# Patient Record
Sex: Male | Born: 1965 | Race: Black or African American | Hispanic: No | Marital: Single | Smoking: Former smoker
Health system: Southern US, Community
[De-identification: ages and names within clinical notes are randomized; demographics above are authoritative.]

## PROBLEM LIST (undated history)

## (undated) DIAGNOSIS — K649 Unspecified hemorrhoids: Secondary | ICD-10-CM

## (undated) DIAGNOSIS — E119 Type 2 diabetes mellitus without complications: Secondary | ICD-10-CM

## (undated) DIAGNOSIS — N492 Inflammatory disorders of scrotum: Secondary | ICD-10-CM

## (undated) DIAGNOSIS — G709 Myoneural disorder, unspecified: Secondary | ICD-10-CM

## (undated) HISTORY — DX: Inflammatory disorders of scrotum: N49.2

## (undated) HISTORY — DX: Unspecified hemorrhoids: K64.9

## (undated) HISTORY — PX: INCISE AND DRAIN ABCESS: PRO64

## (undated) HISTORY — PX: ULNAR NERVE TRANSPOSITION: SHX2595

## (undated) HISTORY — DX: Type 2 diabetes mellitus without complications: E11.9

## (undated) HISTORY — PX: CATARACT EXTRACTION: SUR2

## (undated) HISTORY — DX: Myoneural disorder, unspecified: G70.9

---

## 2004-08-12 ENCOUNTER — Observation Stay (HOSPITAL_COMMUNITY): Admission: RE | Admit: 2004-08-12 | Discharge: 2004-08-12 | Payer: Self-pay | Admitting: Family Medicine

## 2006-01-08 IMAGING — CR DG ABDOMEN ACUTE W/ 1V CHEST
3 series · 3 of 3 positions shown · non-contrast
Comparison: none

CLINICAL DATA: Partial SBO.
 ABDOMEN ? 2 VIEWS:
 Supine and upright views continue to show a few small air-fluid levels in mildly dilated small intestine.  No free air is seen.  There is iodinated contrast in the bladder.  Bony structures are unremarkable.
 CHEST ? 1 VIEW:
 Heart size is normal.  The mediastinum is unremarkable.  The lungs are clear.  No free air is seen under the diaphragm.

[view not recorded (1 of 3)]
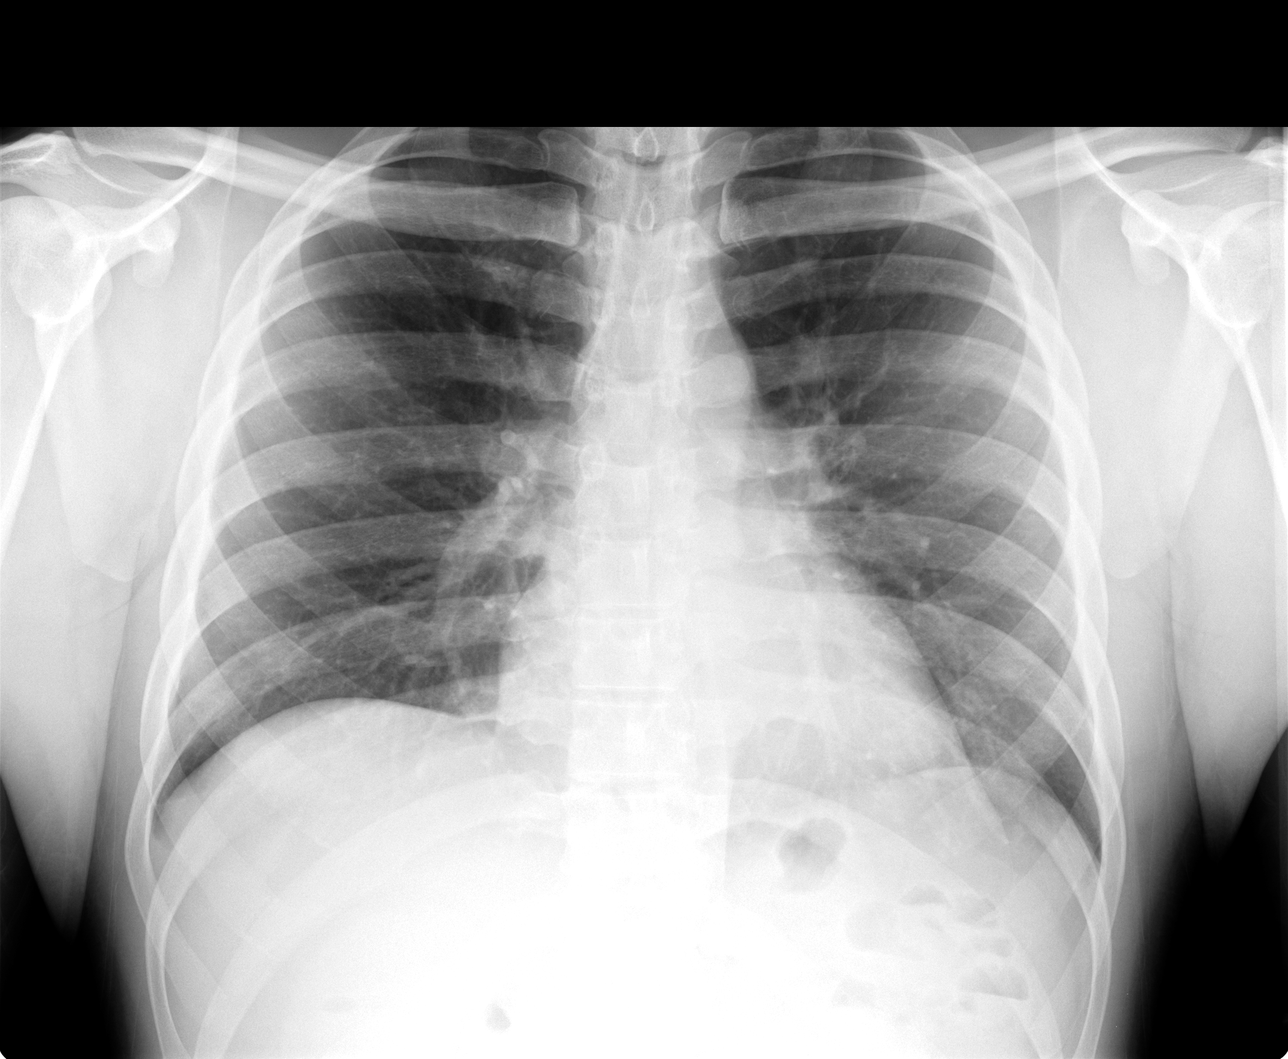

[view not recorded (2 of 3)]
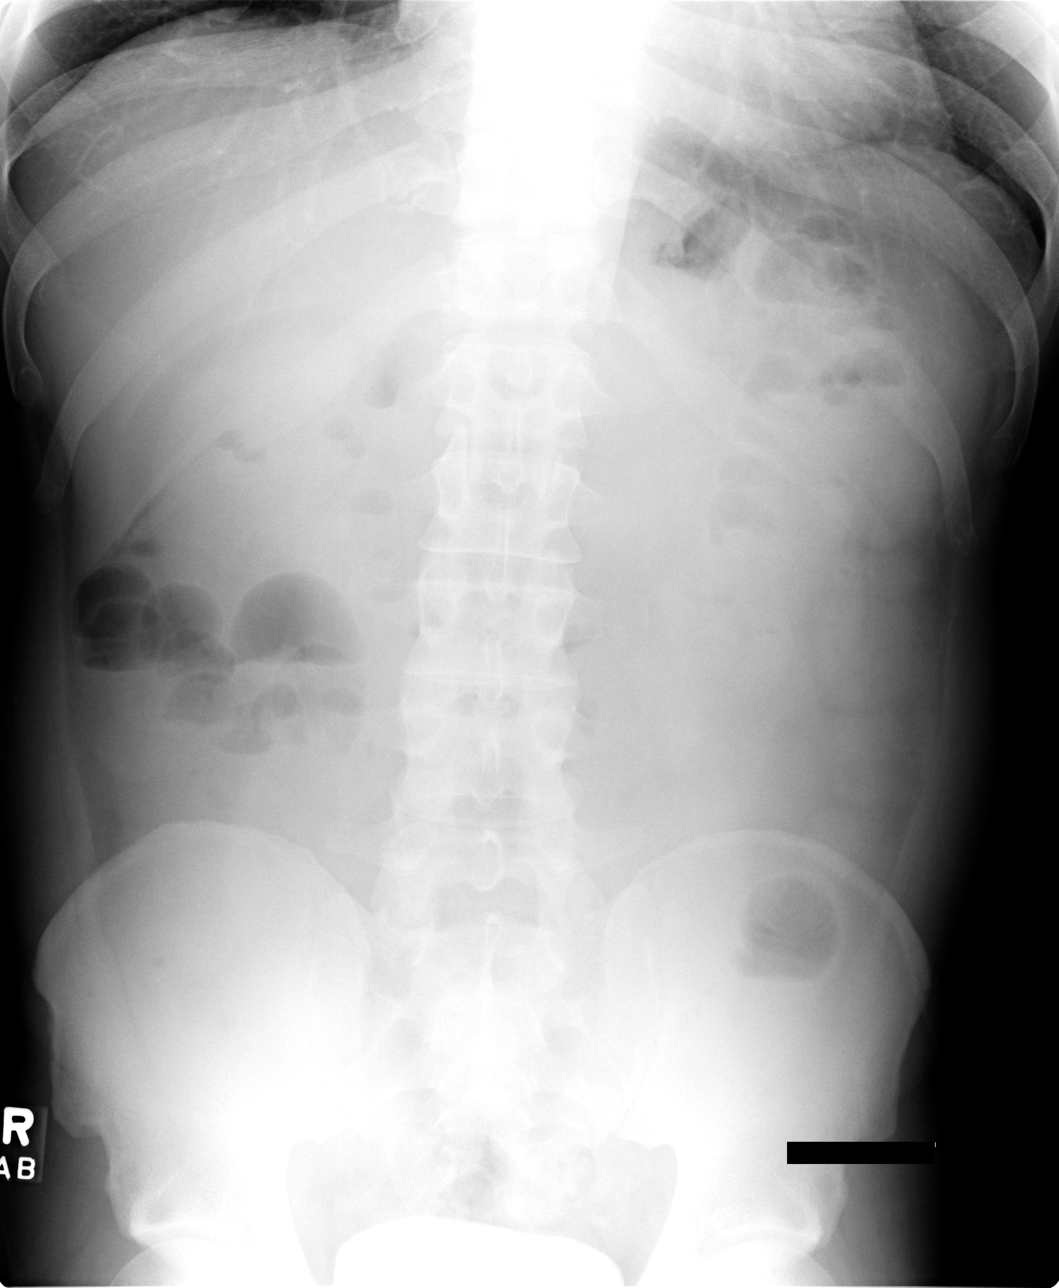

[view not recorded (3 of 3)]
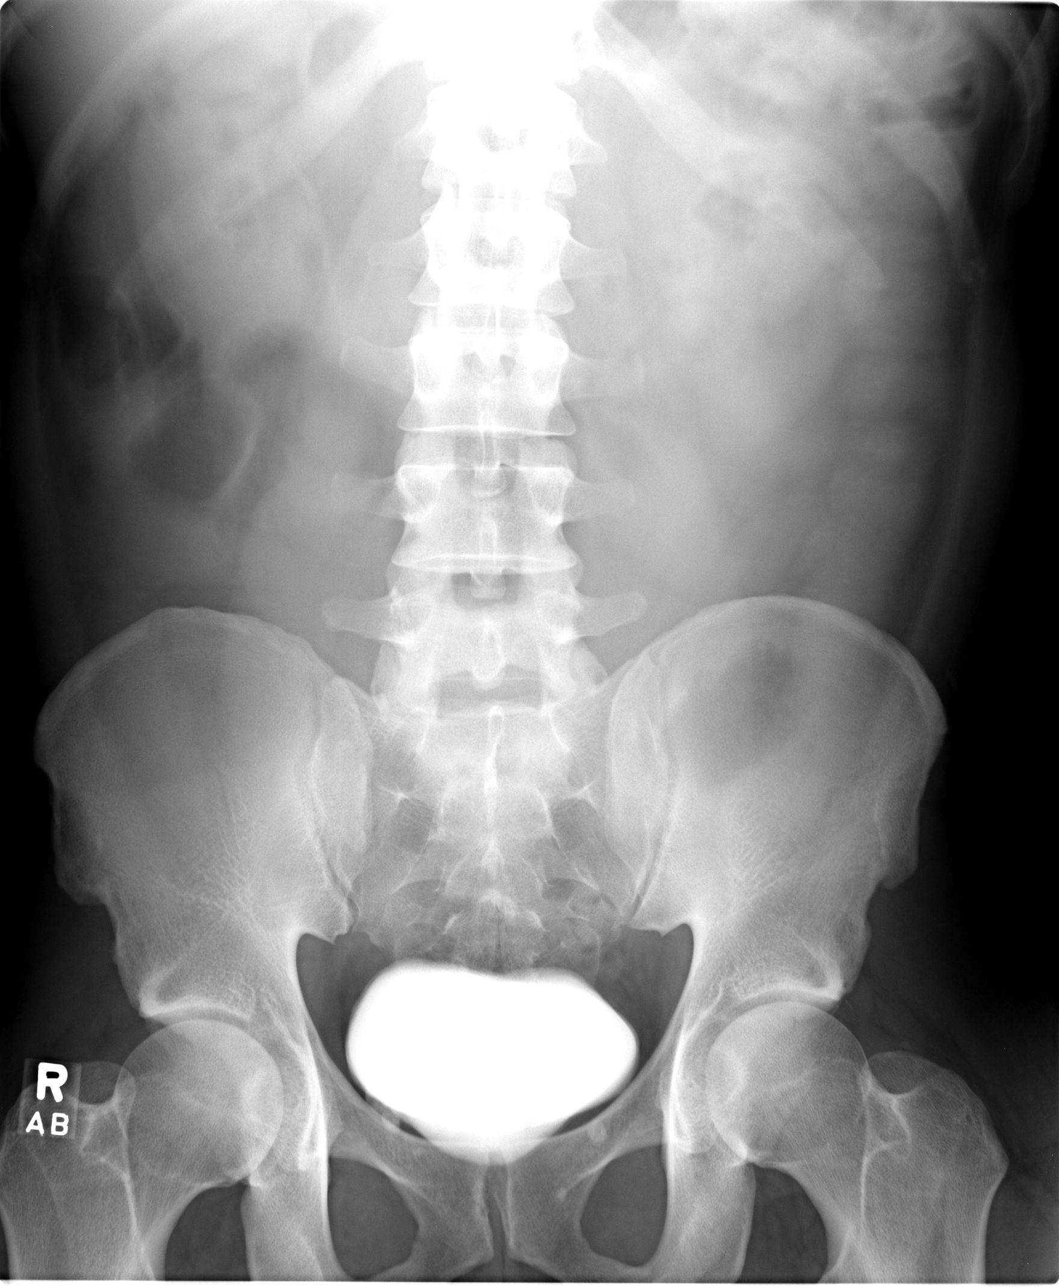

[3 of 3 positions shown; findings below may reference images not displayed]

IMPRESSION: A few dilated fluid and air-filled loops of small bowel suggesting some residual small bowel obstruction.

## 2006-01-08 IMAGING — US US ABDOMEN COMPLETE
1 series · 14 of 25 positions shown · non-contrast
Comparison: none

CLINICAL DATA: 38-year-old with small bowel obstruction and abdominal pain.
 ABDOMINAL ULTRASOUND:
 Liver is homogeneous in echotexture.  There is no intrahepatic or extrahepatic biliary ductal dilatation.  The gallbladder has a normal appearance.  The inferior vena cava and abdominal aorta are normal in caliber.  No focal abnormality is seen in the pancreas, spleen, or right kidney.  Within the mid to lower pole region of the left kidney, there is a small angiomyolipoma versus capsular lipoma, a benign lesion.

[Series 1: abdomen · 0.33mm/px · 14 of 95 slices shown]
[im 1/95]
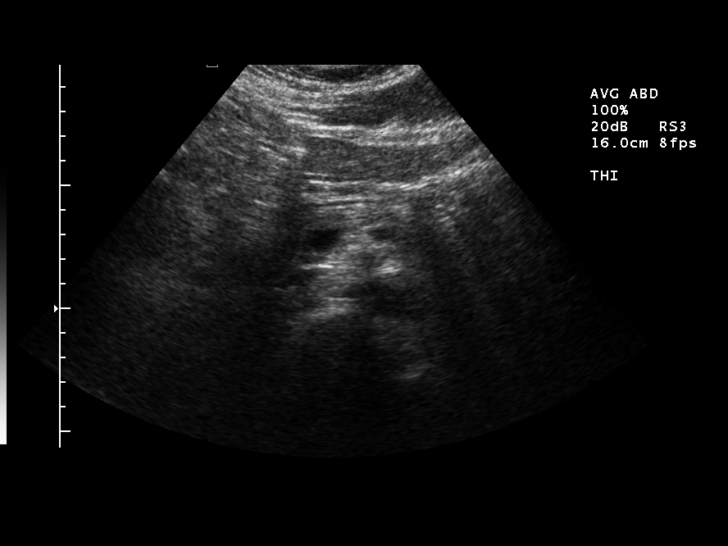
[im 8/95]
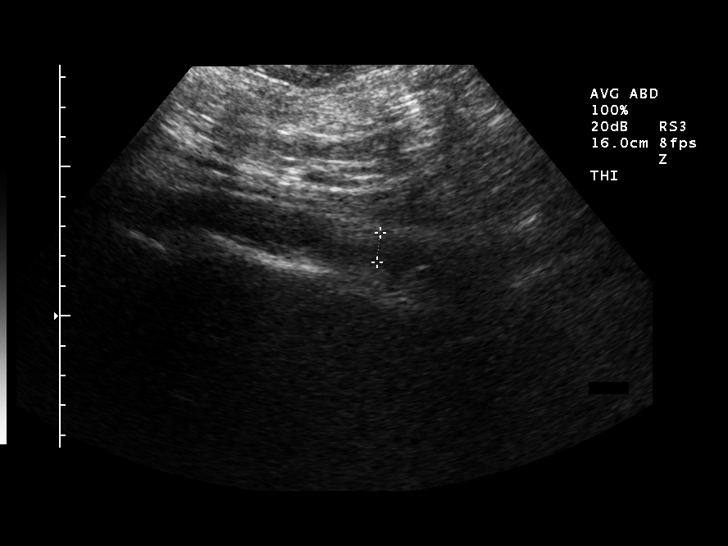
[im 16/95]
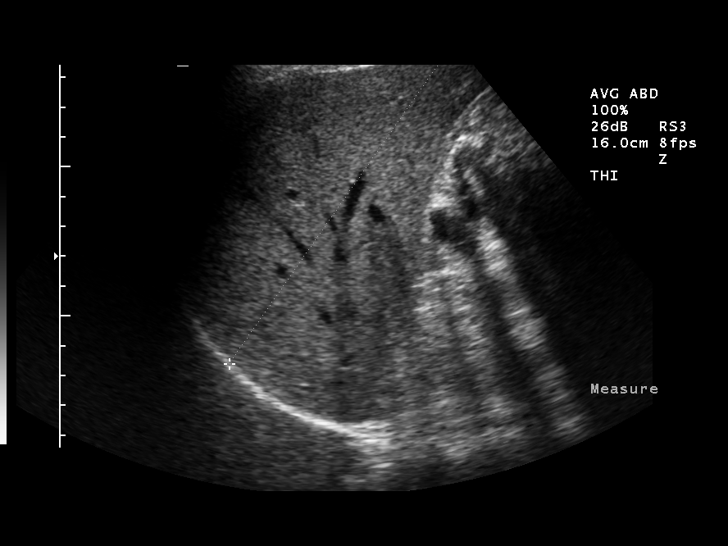
[im 24/95]
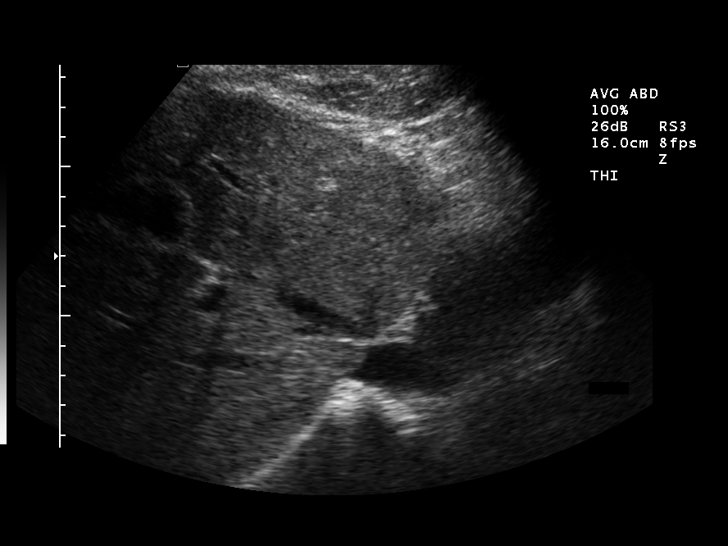
[im 32/95]
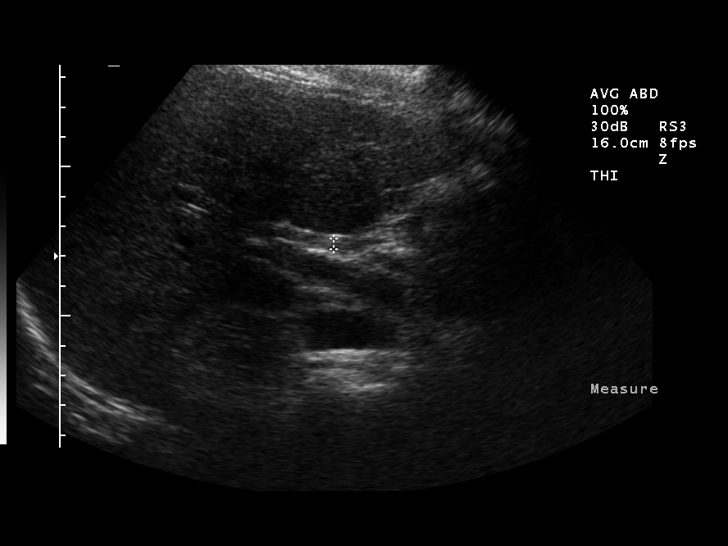
[im 36/95]
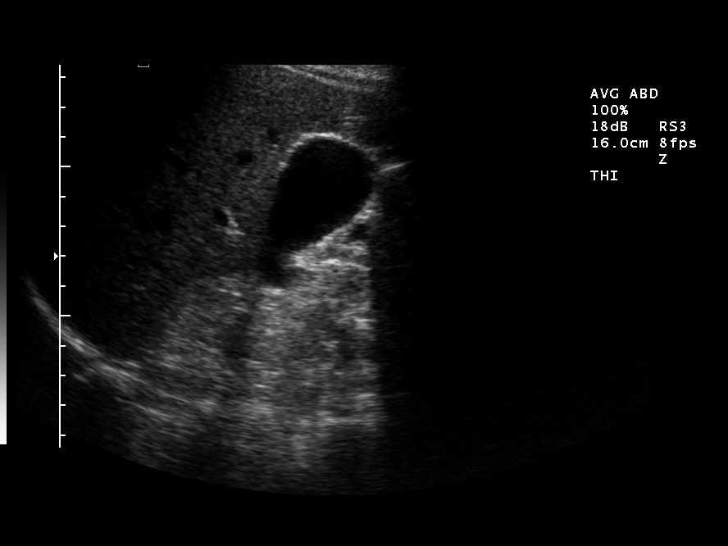
[im 44/95]
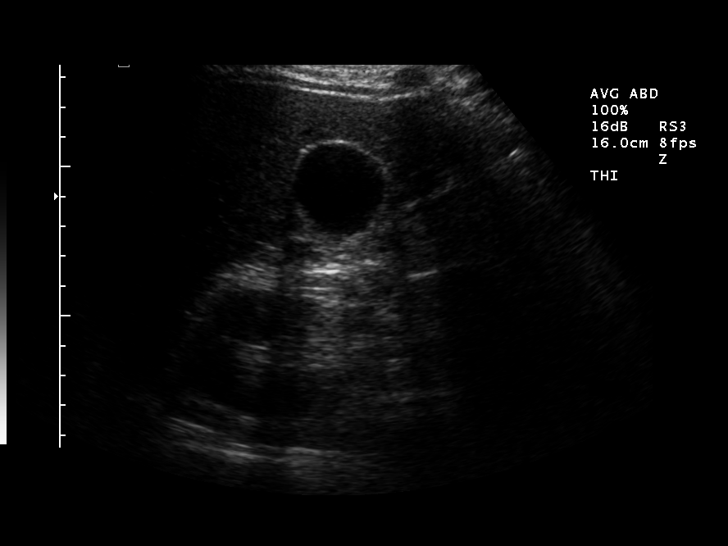
[im 51/95]
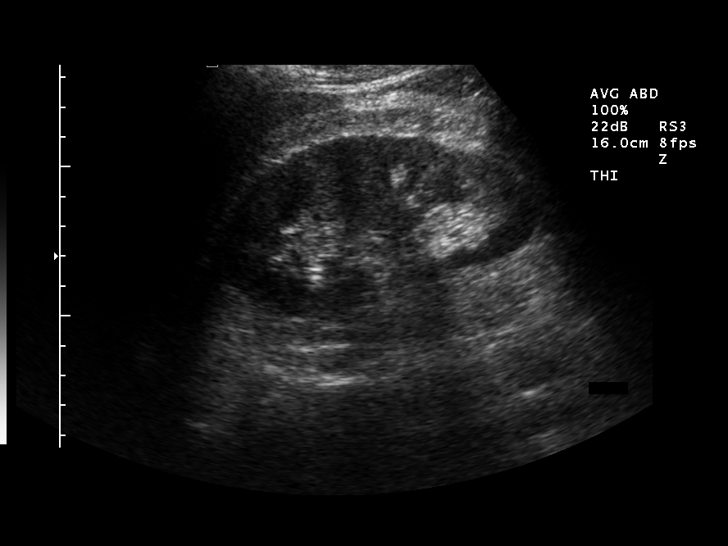
[im 59/95]
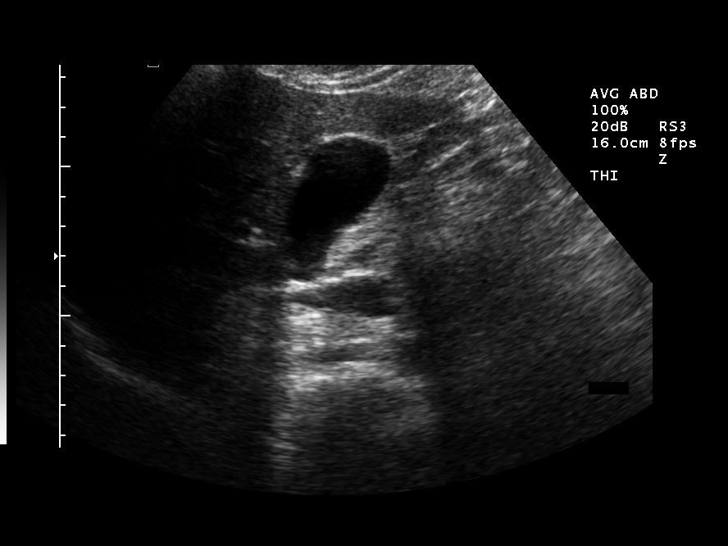
[im 63/95]
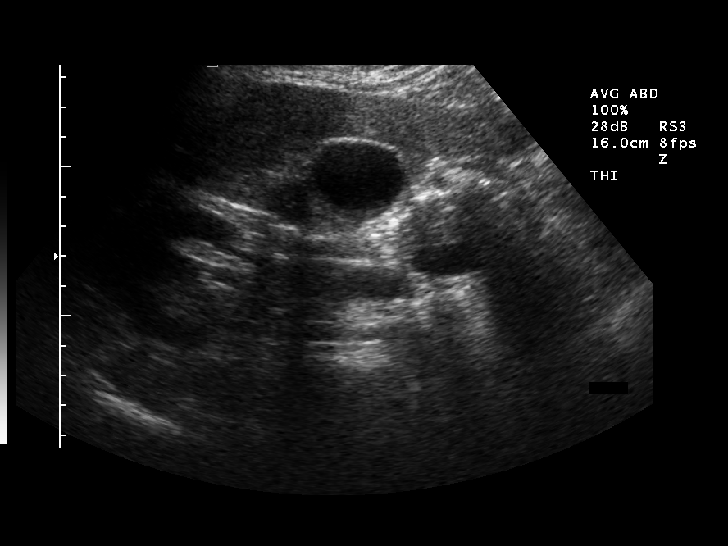
[im 71/95]
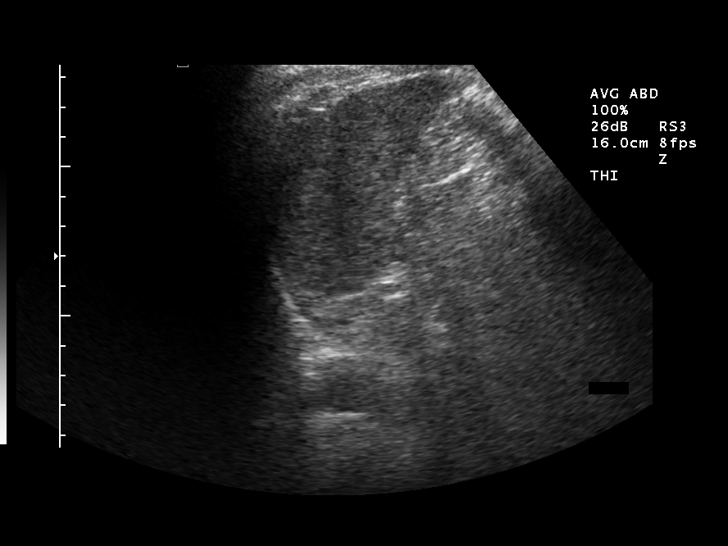
[im 79/95]
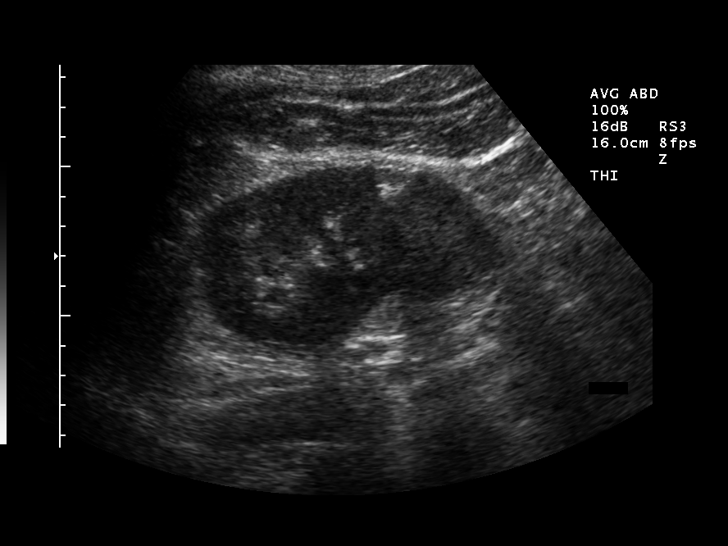
[im 87/95]
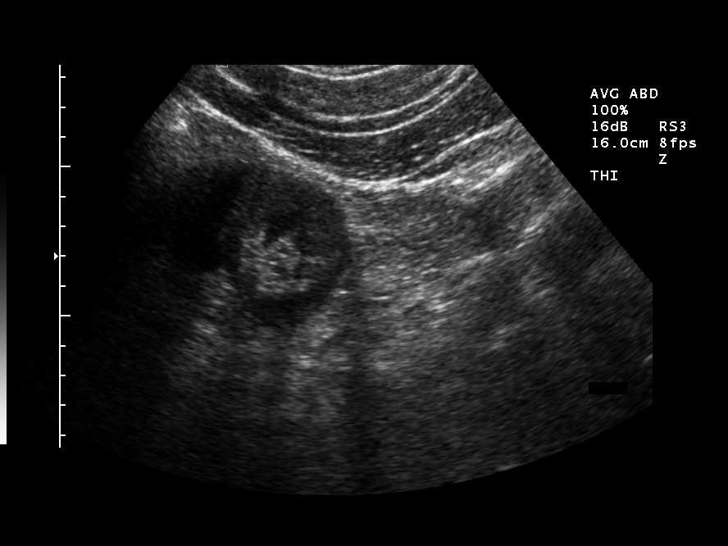
[im 95/95]
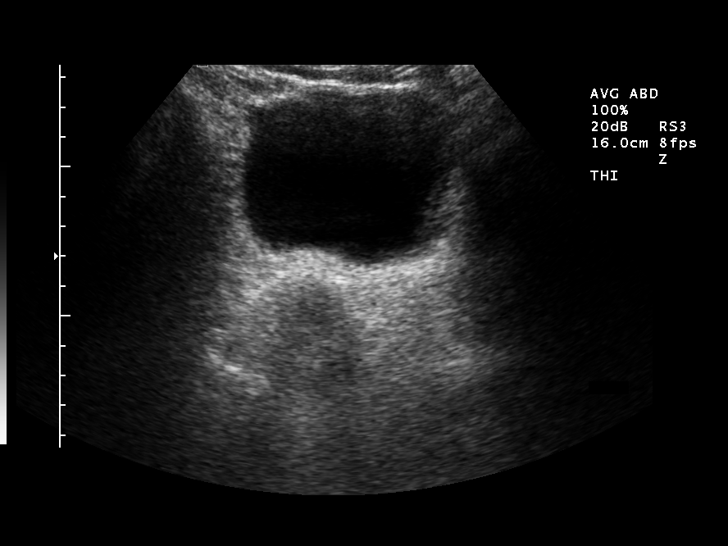

[14 of 25 positions shown; findings below may reference images not displayed]

IMPRESSION: 1.  No evidence for acute abnormality of the abdomen.
 2.  Angiomyolipoma versus small capsular lipoma left kidney.

## 2006-01-08 IMAGING — CT CT ABDOMEN W/ CM
1 of 4 series · 13 of 32 positions shown, 18 images · IV contrast (APPLIED)
Comparison: none

CLINICAL DATA: Abdominal pain.  Evaluate for obstruction or appendicitis.  
CT ABDOMEN WITH CONTRAST:
Multidetector helical scans through the abdomen were performed after IV contrast media were given.  145 cc of Omnipaque 300 were given as the contrast media.  
The lung bases are clear.  The liver enhances normally with no focal abnormality.  On delayed images, the liver is somewhat low in attenuation which may indicate fatty infiltration.  No calcified gallstones are seen.  The pancreas is normal in size and the adrenal glands and spleen appear normal.  The kidneys enhance normally and on delayed images the pelvocaliceal systems appear normal.  No adenopathy is seen.  However, there are multiple dilated fluid filled loops of small bowel consistent with partial small bowel obstruction.  CT of the pelvis is to be performed.

[Series 2: abd/pelvis 5.0 b30f · axial · 0.70mm/px · z∈[-445,-50]mm · 13 of 89 slices shown, 18 images]
[im 5/89  soft-tissue]
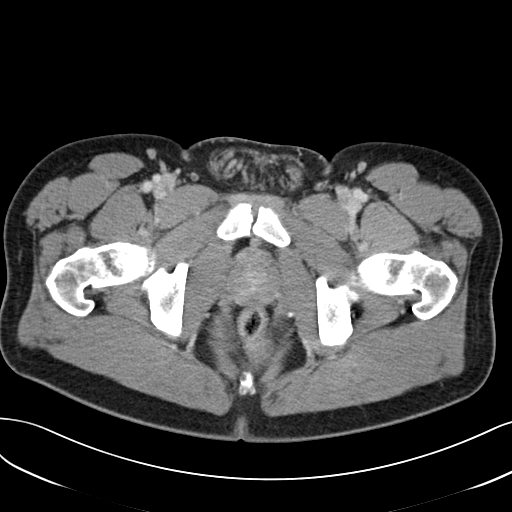
[im 5/89  bone]
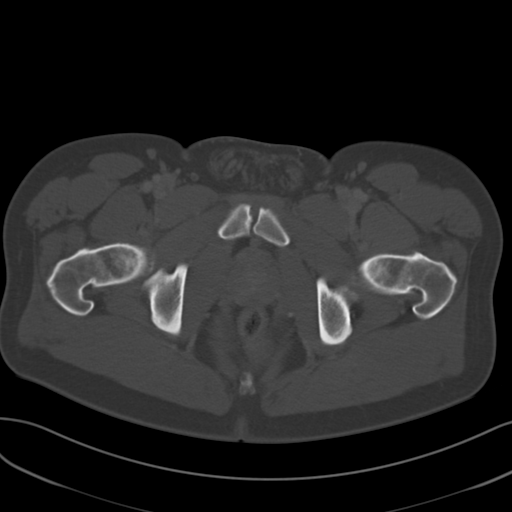
[im 14/89  soft-tissue]
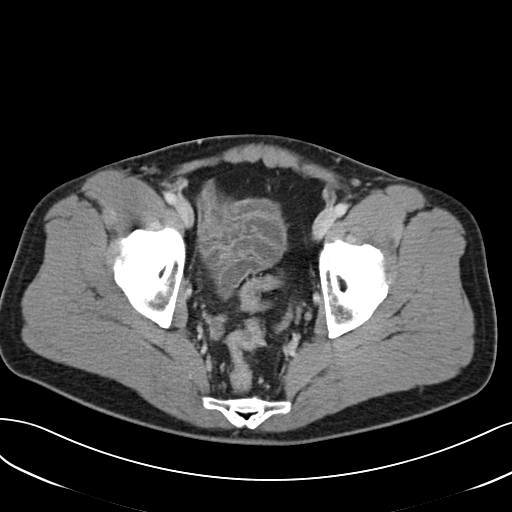
[im 19/89  soft-tissue]
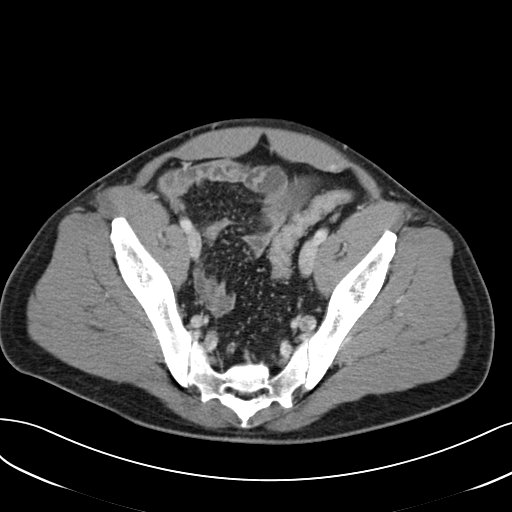
[im 28/89  soft-tissue]
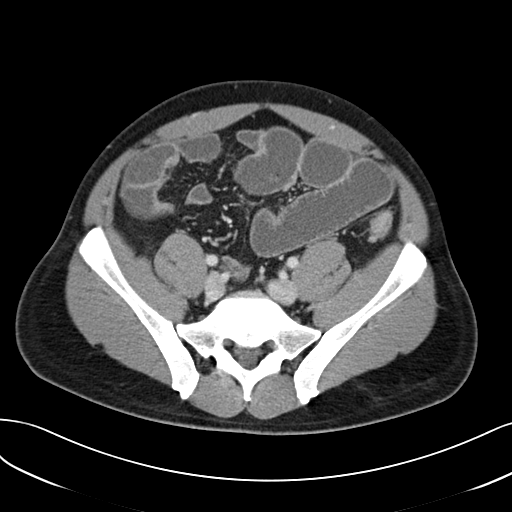
[im 33/89  soft-tissue]
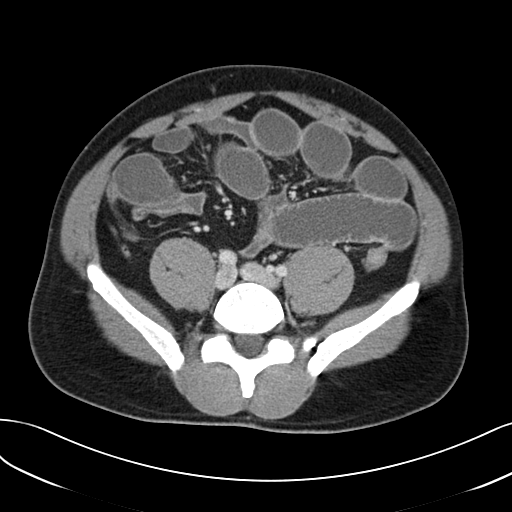
[im 42/89  soft-tissue]
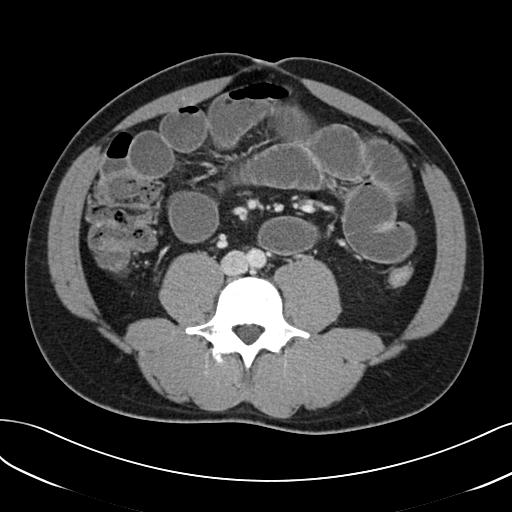
[im 47/89  soft-tissue]
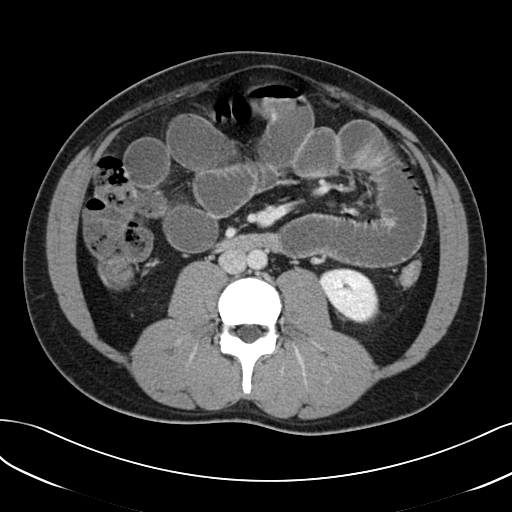
[im 56/89  soft-tissue]
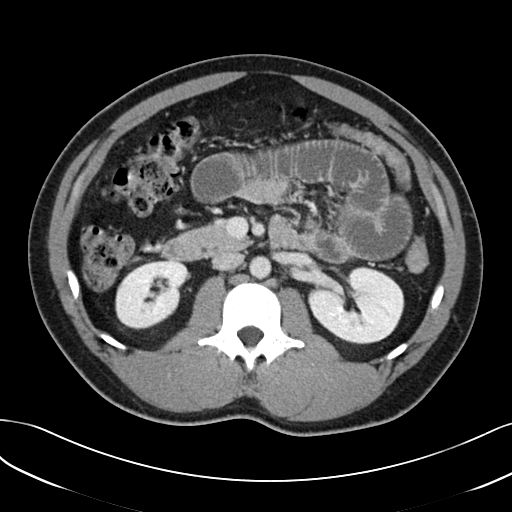
[im 61/89  soft-tissue]
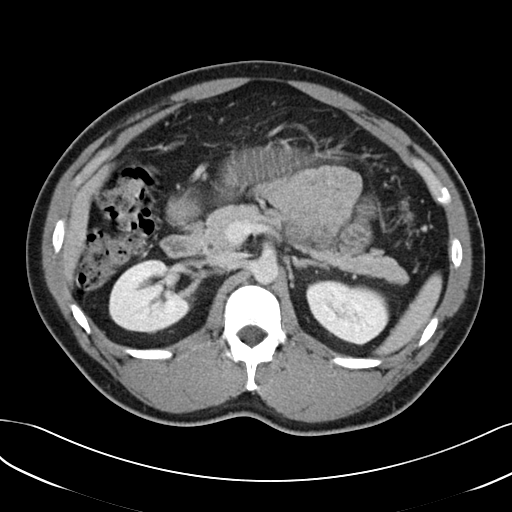
[im 61/89  bone]
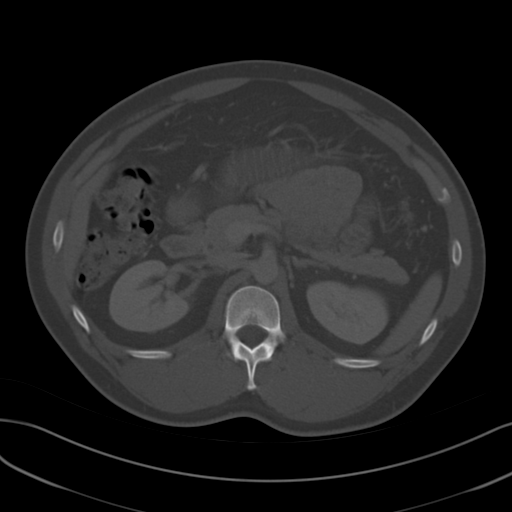
[im 70/89  soft-tissue]
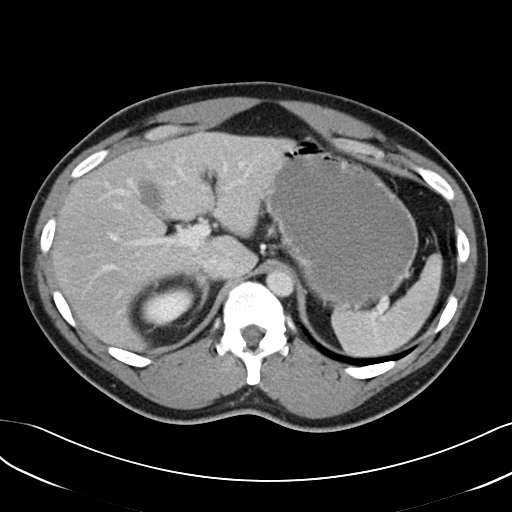
[im 70/89  lung]
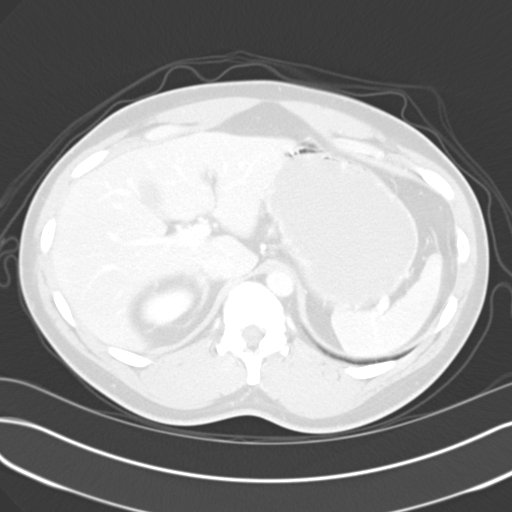
[im 75/89  soft-tissue]
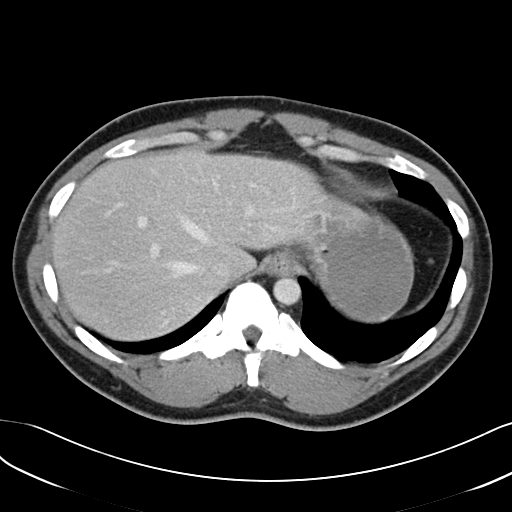
[im 75/89  lung]
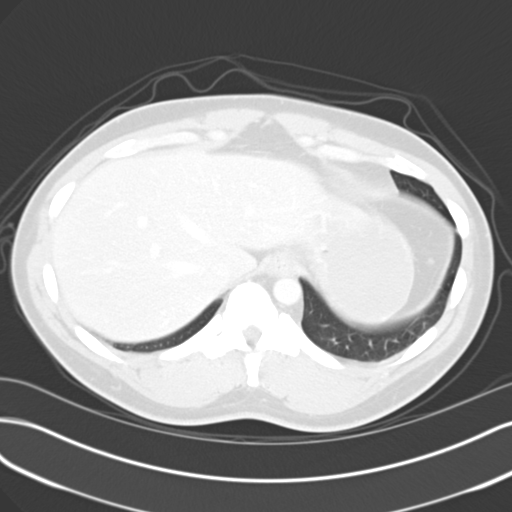
[im 79/89  lung]
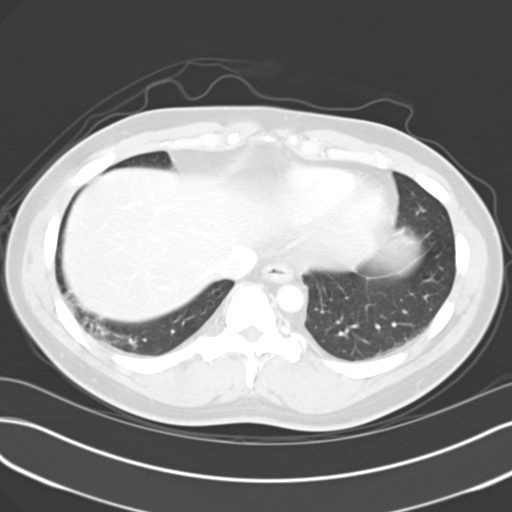
[im 84/89  soft-tissue]
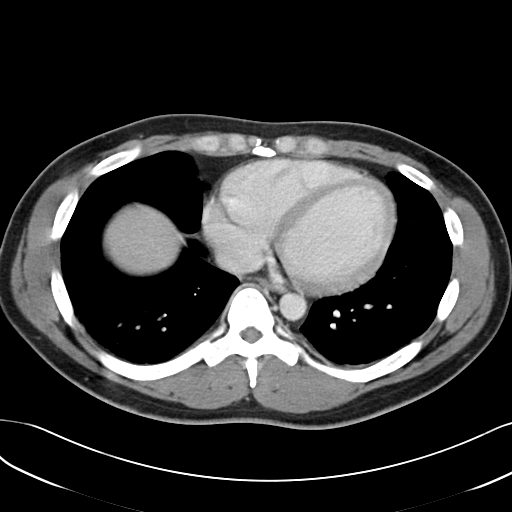
[im 84/89  lung]
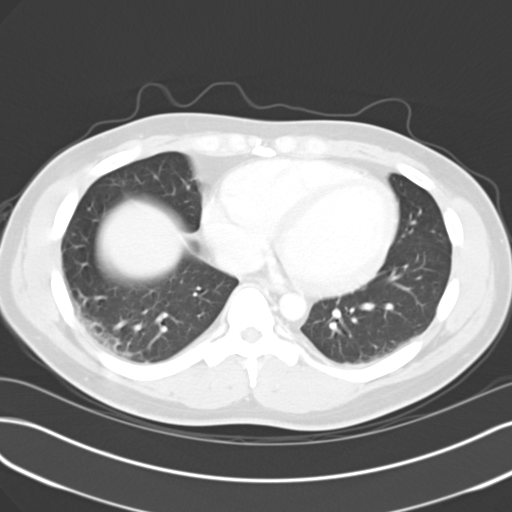

[13 of 32 positions shown; findings below may reference images not displayed]

IMPRESSION: Partial small bowel obstruction.  CT of the pelvis to be performed. 
CT PELVIS:
Scans were continued through the pelvis after IV contrast medial was given.  The dilated fluid filled loops of small bowel noted on CT of the abdomen continue into the pelvis.  There are decompressed loops of small bowel however of the distal ileum.  The point of caliber change appears to be in the right lower quadrant ? right pelvis without definite mass or inflammatory process.  The appendix is relatively well seen and appears normal.  The urinary bladder is decompressed.  No free fluid is seen within the pelvis.  No bony abnormality is noted.   There is an area of higher attenuation, which may be very near the caliber change in the right lower quadrant ? right bony pelvis.  Mass at this site cannot be excluded and further evaluation with small bowel follow through may be helpful to assess further.
IMPRESSION: Partial small bowel obstruction with caliber change in right lower quadrant ? right pelvis with no definite inflammatory process.  The appendix appears normal.  Difficult to exclude mass or adhesion at point of caliber change as noted above.  Consider small bowel follow through.  
This report was called to Dr. Jerman at the time of interpretation who will request surgical consultation.

## 2011-07-16 ENCOUNTER — Ambulatory Visit (INDEPENDENT_AMBULATORY_CARE_PROVIDER_SITE_OTHER): Payer: PRIVATE HEALTH INSURANCE

## 2011-07-16 DIAGNOSIS — K624 Stenosis of anus and rectum: Secondary | ICD-10-CM

## 2011-07-16 DIAGNOSIS — K645 Perianal venous thrombosis: Secondary | ICD-10-CM

## 2011-07-16 DIAGNOSIS — R42 Dizziness and giddiness: Secondary | ICD-10-CM

## 2011-07-18 ENCOUNTER — Ambulatory Visit (INDEPENDENT_AMBULATORY_CARE_PROVIDER_SITE_OTHER): Payer: PRIVATE HEALTH INSURANCE

## 2011-07-18 DIAGNOSIS — K644 Residual hemorrhoidal skin tags: Secondary | ICD-10-CM

## 2011-07-19 ENCOUNTER — Ambulatory Visit (INDEPENDENT_AMBULATORY_CARE_PROVIDER_SITE_OTHER): Payer: PRIVATE HEALTH INSURANCE | Admitting: General Surgery

## 2011-07-19 ENCOUNTER — Encounter (INDEPENDENT_AMBULATORY_CARE_PROVIDER_SITE_OTHER): Payer: Self-pay | Admitting: General Surgery

## 2011-07-19 VITALS — BP 118/78 | HR 88 | Temp 99.0°F | Resp 18 | Ht 69.0 in | Wt 182.2 lb

## 2011-07-19 DIAGNOSIS — K645 Perianal venous thrombosis: Secondary | ICD-10-CM

## 2011-07-19 MED ORDER — HYDROCORTISONE 2.5 % RE CREA: TOPICAL_CREAM | Freq: Four times a day (QID) | RECTAL | Status: AC

## 2011-07-19 NOTE — Progress Notes (Signed)
Patient ID: Jay Nunez, male   DOB: [birth year], 45 y.o.   MRN: [id number]  Chief Complaint  Patient presents with  . Other    est pt new prob- eval hems    HPI Jay Nunez is a 45 y.o. male.   HPI   He presents today for evaluation of painful external hemorrhoids. 6 days ago he awoke with severe pain and swelling in the anal area. He went to urgent care and had an incision done of an external hemorrhoid. However, he continued to bother him and so he presents today for further evaluation and treatment. He has been doing warm water soaks. He is not taking any medication or doing any topical therapy at this time. Past Medical History  Diagnosis Date  . Neuromuscular disorder   . Hemorrhoid   . Scrotal abscess     Past Surgical History  Procedure Date  . Incise and drain abcess     ingrown hair on testicle  . Ulnar nerve transposition     left    Family History  Problem Relation Age of Onset  . Cancer Father     prostate    Social History History  Substance Use Topics  . Smoking status: Current Everyday Smoker -- 0.2 packs/day  . Smokeless tobacco: Not on file  . Alcohol Use: Yes    No Known Allergies  Current Outpatient Prescriptions  Medication Sig Dispense Refill  . Multiple Vitamin (MULTIVITAMIN) capsule Take 1 capsule by mouth daily.        . hydrocortisone (ANUSOL-HC) 2.5 % rectal cream Place rectally 4 (four) times daily.  30 g  2    Review of Systems Review of Systems  Gastrointestinal:       Anal pain. Anal swelling. Denies constipation.    Blood pressure 118/78, pulse 88, temperature 99 F (37.2 C), temperature source Temporal, resp. rate 18, height 5\' 9"  (1.753 m), weight 182 lb 3.2 oz (82.645 kg).  Physical Exam Physical Exam  Constitutional: He appears well-developed and well-nourished. No distress.  Genitourinary:       Diffuse external anal swelling and tenderness is present.    Data Reviewed Note  From Urgent  care.  Assessment  Thrombosed external hemorrhoids with diffuse reactive swelling.        Plan    Warm water soaks. Anusol-HC topical cream. Stools softer twice a day and avoid constipation. TUCKS pads.  Return visit one month. I told him it may take 2-3 weeks for this to improve.       Jay Nunez J 07/19/2011, 4:15 PM

## 2011-07-19 NOTE — Patient Instructions (Signed)
Warm water tub soaks for 10 minutes 2-3 times per day. Take a stool softener twice a day. Avoid constipation and take a laxative as needed. Avoid heavy lifting and straining. Use TUCKS pads 4 times a day. Take an anti-inflammatory medication (Ibuprofen or Aleve) for pain. This will take 2-3 weeks to improve.

## 2011-08-20 ENCOUNTER — Encounter (INDEPENDENT_AMBULATORY_CARE_PROVIDER_SITE_OTHER): Payer: PRIVATE HEALTH INSURANCE | Admitting: General Surgery

## 2011-09-07 ENCOUNTER — Encounter (INDEPENDENT_AMBULATORY_CARE_PROVIDER_SITE_OTHER): Payer: PRIVATE HEALTH INSURANCE | Admitting: General Surgery

## 2011-09-10 ENCOUNTER — Encounter (INDEPENDENT_AMBULATORY_CARE_PROVIDER_SITE_OTHER): Payer: Self-pay | Admitting: General Surgery

## 2012-04-16 ENCOUNTER — Ambulatory Visit (INDEPENDENT_AMBULATORY_CARE_PROVIDER_SITE_OTHER): Payer: PRIVATE HEALTH INSURANCE | Admitting: Family Medicine

## 2012-04-16 VITALS — BP 116/72 | HR 71 | Temp 98.3°F | Resp 16 | Ht 68.5 in | Wt 182.0 lb

## 2012-04-16 DIAGNOSIS — G5620 Lesion of ulnar nerve, unspecified upper limb: Secondary | ICD-10-CM

## 2012-04-16 DIAGNOSIS — S01501A Unspecified open wound of lip, initial encounter: Secondary | ICD-10-CM

## 2012-04-16 DIAGNOSIS — S4990XA Unspecified injury of shoulder and upper arm, unspecified arm, initial encounter: Secondary | ICD-10-CM

## 2012-04-16 DIAGNOSIS — S4980XA Other specified injuries of shoulder and upper arm, unspecified arm, initial encounter: Secondary | ICD-10-CM

## 2012-04-16 DIAGNOSIS — S01511A Laceration without foreign body of lip, initial encounter: Secondary | ICD-10-CM

## 2012-04-16 MED ORDER — TRAMADOL HCL 50 MG PO TABS: 50.0000 mg | ORAL_TABLET | Freq: Three times a day (TID) | ORAL | Status: AC | PRN

## 2012-04-16 MED ORDER — CEPHALEXIN 500 MG PO CAPS: 1000.0000 mg | ORAL_CAPSULE | Freq: Two times a day (BID) | ORAL | Status: AC

## 2012-04-16 NOTE — Progress Notes (Signed)
[facility] [address], [place] [address] [phone]  Date:  04/16/2012   Name:  Jay Nunez   DOB:  November 10, 1965   MRN:  [id number]  PCP:  Provider Not In System    Chief Complaint: Mouth Injury   History of Present Illness:  Jay Nunez is a 46 y.o. very pleasant male patient who presents with the following:  On Saturday he was trying to load a lawn tractor onto a trailer- the mower got away from him.  It rolled back down the ramp and and hurt his lip and shoulder and slightly scraped his right elbow.  Today is Wednesday. He has been out of town and could not get in until today.  [place] in [place] [facility] when he got hurt.    He is concerned about his lip and shoulder.  His shoulder has hurt more over the last couple of days.  It hurts to abduct the shoulder.  His lip is swollen and seems to have some discharge light it might be infected  There was no head injury except for the abrasion to his lip.  He was not knocked out.    He has used some Advil or tylenol as needed for his pain.    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   Patient Active Problem List  Diagnosis  . External thrombosed hemorrhoids    Past Medical History  Diagnosis Date  . Neuromuscular disorder   . Hemorrhoid   . Scrotal abscess     Past Surgical History  Procedure Date  . Incise and drain abcess     ingrown hair on testicle  . Ulnar nerve transposition     left    History  Substance Use Topics  . Smoking status: Current Every Day Smoker -- 0.2 packs/day  . Smokeless tobacco: Not on file  . Alcohol Use: Yes    Family History  Problem Relation Age of Onset  . Cancer Father     prostate    No Known Allergies  Medication list has been reviewed and updated.  Current Outpatient Prescriptions on File Prior to Visit  Medication Sig Dispense Refill  . Multiple Vitamin  (MULTIVITAMIN) capsule Take 1 capsule by mouth daily.          Review of Systems:  As per HPI- otherwise negative.   Physical Examination: Filed Vitals:   04/16/12 0933  BP: 116/72  Pulse: 71  Temp: 98.3 F (36.8 C)  Resp: 16   Filed Vitals:   04/16/12 0933  Height: 5' 8.5" (1.74 m)  Weight: 182 lb (82.555 kg)   Body mass index is 27.27 kg/(m^2). Ideal Body Weight: Weight in (lb) to have BMI = 25: 166.5   GEN: WDWN, NAD, Non-toxic, A & O x 3 HEENT: Atraumatic, Normocephalic. Neck supple. No masses, No LAD. Ears and Nose: No external deformity. CV: RRR, No M/G/R. No JVD. No thrill. No extra heart sounds. PULM: CTA B, no wheezes, crackles, rhonchi. No retractions. No resp. distress. No accessory muscle use. ABD: S, NT, ND, +BS. No rebound. No HSM. EXTR: No c/c/e.  Both hands show wasting of the muscular, and his forearms are thin for his build NEURO Normal gait.  PSYCH: Normally interactive. Conversant. Not depressed or anxious appearing.  Calm demeanor.  Lower lip: there is a small wound likely caused by self- bite.  It appears to be slightly infected and is a bit swollen.  His left shoulder is in joint and there is no point tenderness.  He has some pain over the RC insertion with abduction, but has full ROM.  Small abrasion on right elbow, but normal and pain-free ROM  Assessment and Plan: 1. Open lip wound  cephALEXin (KEFLEX) 500 MG capsule  2. Ulnar nerve disease    3. Shoulder injury  traMADol (ULTRAM) 50 MG tablet  4. Lip laceration     Shoulder strain.  Reassurance, tramadol as needed at night.  Lip is infected, but should respond well to abx.  rx keflex, let me know if not better in the next couple of days- Sooner if worse.     Abbe Amsterdam, MD

## 2019-01-28 ENCOUNTER — Other Ambulatory Visit: Payer: Self-pay | Admitting: Internal Medicine

## 2019-01-28 DIAGNOSIS — Z20822 Contact with and (suspected) exposure to covid-19: Secondary | ICD-10-CM

## 2019-01-31 LAB — NOVEL CORONAVIRUS, NAA: SARS-CoV-2, NAA: NOT DETECTED

## 2019-02-04 ENCOUNTER — Telehealth: Payer: Self-pay | Admitting: Hematology

## 2019-02-04 NOTE — Telephone Encounter (Signed)
Pt is aware covid 19 test is negative °

## 2019-08-05 ENCOUNTER — Other Ambulatory Visit: Payer: Self-pay | Admitting: Cardiology

## 2019-08-05 DIAGNOSIS — Z20822 Contact with and (suspected) exposure to covid-19: Secondary | ICD-10-CM

## 2019-08-06 LAB — NOVEL CORONAVIRUS, NAA: SARS-CoV-2, NAA: NOT DETECTED

## 2019-08-21 ENCOUNTER — Encounter (INDEPENDENT_AMBULATORY_CARE_PROVIDER_SITE_OTHER): Payer: Self-pay

## 2019-09-26 ENCOUNTER — Other Ambulatory Visit: Payer: Self-pay

## 2019-09-26 DIAGNOSIS — Z20822 Contact with and (suspected) exposure to covid-19: Secondary | ICD-10-CM

## 2019-09-27 LAB — NOVEL CORONAVIRUS, NAA: SARS-CoV-2, NAA: NOT DETECTED

## 2019-11-28 ENCOUNTER — Other Ambulatory Visit: Payer: Self-pay

## 2019-11-28 ENCOUNTER — Ambulatory Visit: Payer: Self-pay

## 2020-03-30 ENCOUNTER — Other Ambulatory Visit: Payer: Self-pay | Admitting: Critical Care Medicine

## 2020-03-30 ENCOUNTER — Other Ambulatory Visit: Payer: Self-pay

## 2020-03-30 DIAGNOSIS — Z20822 Contact with and (suspected) exposure to covid-19: Secondary | ICD-10-CM

## 2020-04-01 LAB — NOVEL CORONAVIRUS, NAA: SARS-CoV-2, NAA: NOT DETECTED

## 2020-04-01 LAB — SARS-COV-2, NAA 2 DAY TAT

## 2020-05-31 ENCOUNTER — Ambulatory Visit: Payer: Self-pay | Attending: Internal Medicine

## 2020-05-31 DIAGNOSIS — Z23 Encounter for immunization: Secondary | ICD-10-CM

## 2020-05-31 NOTE — Progress Notes (Signed)
   Covid-19 Vaccination Clinic  Name:  Jay Nunez    MRN: [id number] DOB: May 02, 1966  05/31/2020  Jay Nunez was observed post Covid-19 immunization for 15 minutes without incident. He was provided with Vaccine Information Sheet and instruction to access the V-Safe system.   Jay Nunez was instructed to call [phone] with any severe reactions post vaccine: Marland Kitchen Difficulty breathing  . Swelling of face and throat  . A fast heartbeat  . A bad rash all over body  . Dizziness and weakness

## 2020-07-11 ENCOUNTER — Other Ambulatory Visit: Payer: Self-pay

## 2020-07-11 ENCOUNTER — Encounter (HOSPITAL_COMMUNITY): Payer: Self-pay

## 2020-07-11 ENCOUNTER — Ambulatory Visit (HOSPITAL_COMMUNITY)
Admission: EM | Admit: 2020-07-11 | Discharge: 2020-07-11 | Disposition: A | Discharge status: Home / Self Care | Payer: Self-pay | Attending: Family Medicine | Admitting: Family Medicine

## 2020-07-11 DIAGNOSIS — L03011 Cellulitis of right finger: Secondary | ICD-10-CM

## 2020-07-11 MED ORDER — DOXYCYCLINE HYCLATE 100 MG PO TABS: 100.0000 mg | ORAL_TABLET | Freq: Two times a day (BID) | ORAL | 0 refills | Status: DC

## 2020-07-11 NOTE — ED Triage Notes (Signed)
Pt presents with pain and swelling right pinky finger. States having ingrown nail 3-4 days.

## 2020-07-11 NOTE — ED Provider Notes (Signed)
[facility]    CSN: [id number] Arrival date & time: 07/11/20  1332      History   Chief Complaint No chief complaint on file.   HPI Jay Nunez is a 54 y.o. male.   Initial  Jay Nunez urgent care patient visit  Patient's had over a week of increasing pain of the cuticle of his right pinky finger.  He has had a paronychia before and that is what he thinks this is.     Past Medical History:  Diagnosis Date  . Hemorrhoid   . Neuromuscular disorder   . Scrotal abscess     Patient Active Problem List   Diagnosis Date Noted  . 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 External thrombosed hemorrhoids 07/19/2011    Past Surgical History:  Procedure Laterality Date  . INCISE AND DRAIN ABCESS     ingrown hair on testicle  . ULNAR NERVE TRANSPOSITION     left       Home Medications    Prior to Admission medications   Medication Sig Start Date End Date Taking? Authorizing Provider  doxycycline (VIBRA-TABS) 100 MG tablet Take 1 tablet (100 mg total) by mouth 2 (two) times daily. 07/11/20   Elvina Sidle, MD  Multiple Vitamin (MULTIVITAMIN) capsule Take 1 capsule by mouth daily.      [provider]    Family History Family History  Problem Relation Age of Onset  . Cancer Father        prostate    Social History Social History   Tobacco Use  . Smoking status: Current Every Day Smoker    Packs/day: 0.25  Substance Use Topics  . Alcohol use: Yes  . Drug use: No     Allergies   Patient has no known allergies.   Review of Systems Review of Systems   Physical Exam Triage Vital Signs ED Triage Vitals [07/11/20 1521]  Enc Vitals Group     BP 132/73     Pulse Rate 82     Resp 16     Temp 98.1 F (36.7 C)     Temp Source Oral     SpO2 98 %     Weight      Height      Head Circumference      Peak Flow      Pain Score      Pain Loc      Pain Edu?      Excl. in GC?    No data found.  Updated Vital Signs BP 132/73 (BP  Location: Left Arm)   Pulse 82   Temp 98.1 F (36.7 C) (Oral)   Resp 16   SpO2 98%    Physical Exam Vitals and nursing note reviewed.  Constitutional:      Appearance: Normal appearance. He is normal weight.  Musculoskeletal:        General: Swelling and tenderness present. No signs of injury. Normal range of motion.  Skin:    General: Skin is warm and dry.     Comments: Paronychia on outside of right pinky cuticle was I indeed without anesthesia.  Patient had good relief from the tenderness.  Neurological:     Mental Status: He is alert.      UC Treatments / Results  Labs (all labs ordered are listed, but only abnormal results are displayed) Labs Reviewed - No data to display  EKG   Radiology No results found.  Procedures Procedures (including  critical care time)  Medications Ordered in UC Medications - No data to display  Initial Impression / Assessment and Plan / UC Course  I have reviewed the triage vital signs and the nursing notes.  Pertinent labs & imaging results that were available during my care of the patient were reviewed by me and considered in my medical decision making (see chart for details).    Final Clinical Impressions(s) / UC Diagnoses   Final diagnoses:  Paronychia of finger of right hand     Discharge Instructions     Keep soaking the finger in warm soapy water.    ED Prescriptions    Medication Sig Dispense Auth. Provider   doxycycline (VIBRA-TABS) 100 MG tablet Take 1 tablet (100 mg total) by mouth 2 (two) times daily. 14 tablet Elvina Sidle, MD     PDMP not reviewed this encounter.   Elvina Sidle, MD 07/11/20 1536

## 2020-07-11 NOTE — Discharge Instructions (Addendum)
Keep soaking the finger in warm soapy water.

## 2022-01-08 ENCOUNTER — Other Ambulatory Visit (HOSPITAL_COMMUNITY): Payer: Self-pay

## 2022-01-08 ENCOUNTER — Encounter (HOSPITAL_COMMUNITY): Payer: Self-pay | Admitting: *Deleted

## 2022-01-08 ENCOUNTER — Other Ambulatory Visit: Payer: Self-pay

## 2022-01-08 ENCOUNTER — Ambulatory Visit (HOSPITAL_COMMUNITY)
Admission: EM | Admit: 2022-01-08 | Discharge: 2022-01-08 | Disposition: A | Discharge status: Home / Self Care | Payer: Self-pay | Attending: Internal Medicine | Admitting: Internal Medicine

## 2022-01-08 DIAGNOSIS — E1165 Type 2 diabetes mellitus with hyperglycemia: Secondary | ICD-10-CM

## 2022-01-08 DIAGNOSIS — R3121 Asymptomatic microscopic hematuria: Secondary | ICD-10-CM | POA: Insufficient documentation

## 2022-01-08 DIAGNOSIS — R81 Glycosuria: Secondary | ICD-10-CM | POA: Insufficient documentation

## 2022-01-08 DIAGNOSIS — R7309 Other abnormal glucose: Secondary | ICD-10-CM | POA: Insufficient documentation

## 2022-01-08 DIAGNOSIS — R739 Hyperglycemia, unspecified: Secondary | ICD-10-CM | POA: Insufficient documentation

## 2022-01-08 LAB — CBC
HCT: 45.7 % (ref 39.0–52.0)
Hemoglobin: 15.2 g/dL (ref 13.0–17.0)
MCH: 31.7 pg (ref 26.0–34.0)
MCHC: 33.3 g/dL (ref 30.0–36.0)
MCV: 95.2 fL (ref 80.0–100.0)
Platelets: 139 10*3/uL — ABNORMAL LOW (ref 150–400)
RBC: 4.8 MIL/uL (ref 4.22–5.81)
RDW: 11.9 % (ref 11.5–15.5)
WBC: 3.9 10*3/uL — ABNORMAL LOW (ref 4.0–10.5)
nRBC: 0 % (ref 0.0–0.2)

## 2022-01-08 LAB — COMPREHENSIVE METABOLIC PANEL
ALT: 22 U/L (ref 0–44)
AST: 22 U/L (ref 15–41)
Albumin: 4 g/dL (ref 3.5–5.0)
Alkaline Phosphatase: 77 U/L (ref 38–126)
Anion gap: 8 (ref 5–15)
BUN: 13 mg/dL (ref 6–20)
CO2: 26 mmol/L (ref 22–32)
Calcium: 9.9 mg/dL (ref 8.9–10.3)
Chloride: 104 mmol/L (ref 98–111)
Creatinine, Ser: 1.05 mg/dL (ref 0.61–1.24)
GFR, Estimated: >60 mL/min (ref >60)
Glucose, Bld: 438 mg/dL — ABNORMAL HIGH (ref 70–99)
Potassium: 4.8 mmol/L (ref 3.5–5.1)
Sodium: 138 mmol/L (ref 135–145)
Total Bilirubin: 0.7 mg/dL (ref 0.3–1.2)
Total Protein: 7.1 g/dL (ref 6.5–8.1)

## 2022-01-08 LAB — POCT URINALYSIS DIPSTICK, ED / UC
Bilirubin Urine: NEGATIVE
Glucose, UA: 500 mg/dL — AB
Leukocytes,Ua: NEGATIVE
Nitrite: NEGATIVE
Protein, ur: NEGATIVE mg/dL
Specific Gravity, Urine: 1.02 (ref 1.005–1.030)
Urobilinogen, UA: 0.2 mg/dL (ref 0.0–1.0)
pH: 6 (ref 5.0–8.0)

## 2022-01-08 LAB — CBG MONITORING, ED: Glucose-Capillary: 429 mg/dL — ABNORMAL HIGH (ref 70–99)

## 2022-01-08 MED ORDER — METFORMIN HCL 500 MG PO TABS: 500.0000 mg | ORAL_TABLET | Freq: Two times a day (BID) | ORAL | 1 refills | Status: DC

## 2022-01-08 MED ORDER — INSULIN GLARGINE 100 UNIT/ML ~~LOC~~ SOLN
8.0000 [IU] | Freq: Every day | SUBCUTANEOUS | 11 refills | Status: DC
  Filled 2022-01-08: qty 10, 28d supply, fill #0

## 2022-01-08 MED ORDER — INSULIN PEN NEEDLE 30G X 8 MM MISC: 1.0000 | 0 refills | Status: AC | PRN

## 2022-01-08 MED ORDER — INSULIN PEN NEEDLE 30G X 8 MM MISC: 1.0000 | 0 refills | Status: DC | PRN

## 2022-01-08 NOTE — ED Triage Notes (Signed)
Pt reports urinary frequency for 6 months and urinary incontinence started yesterday.

## 2022-01-08 NOTE — ED Provider Notes (Addendum)
[facility]    CSN: [id number] Arrival date & time: 01/08/22  1324      History   Chief Complaint Chief Complaint  Patient presents with   Urinary Frequency   Urinary Incontinence    HPI Jay Nunez is a 56 y.o. male.   Patient presents to urgent care for evaluation of urinary frequency for the last 6 months that has become worse over the last 24 hours. Yesterday he states that he began to urinate while on the way to the bathroom and couldn't hold his bladder. He urinates at least 12 times per day and has to get up at least 2 times at night to urinate. Until yesterday, he was able to make it to the bathroom on time. He drinks 3 8oz cups of coffee per day in the morning before 12 noon and drinks at least a 1/2 gallon to almost a gallon of water per day. He also drinks sweet tea, lemonade, Gatorade, or pineapple juice 2-3 drinks per day. He is a current smoker for the past 10 years and smokes 2-3 cigarettes per day. Denies rectal pain and difficulty defecating. Denies abdominal pain, back pain, nausea, and vomiting. No fever/chills. Last colonoscopy was 5 years ago and was normal. Last primary care appointment was also 5 years ago and labs were normal per patient. He takes allegra allergy medication daily and a multivitamin. States he eats fast food, but does not eat fried foods and he cooks at home most of the time. Mother is a type 2 diabetic and brother is type 1 diabetic. Patient's father died of prostate cancer and was diagnosed at 55 years old and patient's brother was diagnosed with prostate cancer at age 8.    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 Authorizing Provider  insulin glargine (LANTUS) 100 UNIT/ML injection Inject 0.08 mLs (8 Units total) into the skin daily. 01/08/22  Yes Carlisle Beers, FNP  doxycycline (VIBRA-TABS) 100 MG tablet Take 1 tablet (100 mg total) by mouth 2 (two) times daily. 07/11/20   Elvina Sidle, MD  Insulin Pen Needle (NOVOFINE) 30G X 8 MM MISC Inject 10 each into the skin as needed. 01/08/22 04/08/22  Carlisle Beers, FNP  metFORMIN (GLUCOPHAGE) 500 MG tablet Take 1 tablet (500 mg total) by mouth 2 (two) times daily with a meal. 01/08/22 03/09/22  Carlisle Beers, FNP  Multiple Vitamin (MULTIVITAMIN) capsule Take 1 capsule by mouth daily.      [provider]    Family History Family History  Problem Relation Age of Onset   Cancer Father        prostate    Social History Social History   Tobacco Use   Smoking status: Every Day    Packs/day: 0.25    Types: Cigarettes   Smokeless  tobacco: Never  Substance Use Topics   Alcohol use: Yes   Drug use: No     Allergies   Patient has no known allergies.   Review of Systems Review of Systems  Genitourinary:  Positive for frequency.  Per HPI  Physical Exam Triage Vital Signs ED Triage Vitals  Enc Vitals Group     BP 01/08/22 0847 126/81     Pulse Rate 01/08/22 0847 78     Resp 01/08/22 0847 18     Temp 01/08/22 0847 98.3 F (36.8 C)     Temp src --      SpO2 01/08/22 0847 94 %     Weight --      Height --      Head Circumference --      Peak Flow --      Pain Score 01/08/22 0846 0     Pain Loc --      Pain Edu? --      Excl. in GC? --    No data found.  Updated Vital Signs BP 126/81   Pulse 78   Temp 98.3 F (36.8 C)   Resp 18   SpO2  94%   Visual Acuity Right Eye Distance:   Left Eye Distance:   Bilateral Distance:    Right Eye Near:   Left Eye Near:    Bilateral Near:     Physical Exam Vitals and nursing note reviewed.  Constitutional:      General: He is not in acute distress.    Appearance: Normal appearance. He is well-developed and normal weight. He is not ill-appearing or diaphoretic.     Comments: Very pleasant patient sitting comfortably on exam able in no acute distress.   HENT:     Head: Normocephalic and atraumatic.     Right Ear: Tympanic membrane, ear canal and external ear normal.     Left Ear: Tympanic membrane, ear canal and external ear normal.     Nose: Nose normal.     Mouth/Throat:     Mouth: Mucous membranes are moist.     Pharynx: No posterior oropharyngeal erythema.  Eyes:     General: Lids are normal. Vision grossly intact. Gaze aligned appropriately.     Extraocular Movements: Extraocular movements intact.     Conjunctiva/sclera: Conjunctivae normal.     Right eye: Right conjunctiva is not injected.     Left eye: Left conjunctiva is not injected.  Cardiovascular:     Rate and Rhythm: Normal rate and regular rhythm.     Heart sounds: Normal heart sounds, S1 normal and S2 normal. No murmur heard.   No friction rub. No gallop.  Pulmonary:     Effort: Pulmonary effort is normal. No respiratory distress.     Breath sounds: Normal breath sounds. No decreased air movement.  Abdominal:     General: Bowel sounds are normal. There is no distension.     Palpations: Abdomen is soft.     Tenderness: There is no abdominal tenderness. There is no right CVA tenderness or left CVA tenderness.  Musculoskeletal:        General: No swelling.     Cervical back: Normal range of motion and neck supple.     Right lower leg: No edema.     Left lower leg: No edema.  Lymphadenopathy:     Cervical: No cervical adenopathy.  Skin:    General: Skin is warm and dry.     Capillary Refill: Capillary  refill takes less than 2 seconds.     Findings: No rash.  Neurological:     General: No focal deficit present.     Mental Status: He is alert and oriented to person, place, and time. Mental status is at baseline.     Motor: No weakness.     Gait: Gait is intact. Gait normal.  Psychiatric:        Attention and Perception: Attention and perception normal.        Mood and Affect: Mood normal.        Speech: Speech normal.        Behavior: Behavior normal. Behavior is cooperative.        Thought Content: Thought content normal.        Cognition and Memory: Cognition and memory normal.        Judgment: Judgment normal.     UC Treatments / Results  Labs (all labs ordered are listed, but only abnormal results are displayed) Labs Reviewed  POCT URINALYSIS DIPSTICK, ED / UC - Abnormal; Notable for the following components:      Result Value   Glucose, UA 500 (*)    Ketones, ur TRACE (*)    Hgb urine dipstick MODERATE (*)    All other components within normal limits  CBG MONITORING, ED - Abnormal; Notable for the following components:   Glucose-Capillary 429 (*)    All other components within normal limits  URINE CULTURE  CBC  COMPREHENSIVE METABOLIC PANEL  HEMOGLOBIN A1C    EKG   Radiology No results found.  Procedures Procedures (including critical care time)  Medications Ordered in UC Medications - No data to display  Initial Impression / Assessment and Plan / UC Course  I have reviewed the triage vital signs and the nursing notes.  Pertinent labs & imaging results that were available during my care of the patient were reviewed by me and considered in my medical decision making (see chart for details).  Patient is a 56 year old male presenting to urgent care today with urinary frequency and urgency.  Urinalysis in clinic today shows moderate hemoglobin, ketonuria, and glycosuria.  CBG in clinic is 429.  Symptoms and lab findings today are consistent with type 2 diabetes  etiology.  Patient does not have insurance at this time and does not have a primary care provider.  Hematuria is also concerning for possible malignancy to bladder as patient is a current smoker and has been for the last 10 years.  Walking referral to urology given.  Patient advised to schedule an appointment with either [facility] or [facility] for a soon as possible for diabetes management.  Physical exam is negative for signs of systemic dehydration at this time and patient denies subjective nausea and vomiting.  There is no abdominal pain and no concern for DKA at this time.  Initiated metformin 500 mg twice daily and Lantus 8 units once daily for type 2 diabetes management at this time.  CBC, CMP, and hemoglobin A1c drawn in clinic today and are pending.  Patient given resources and information for appropriate food choices for diabetes and discharge packet.  Discussed same with patient.  Encouraged and advised patient to follow-up with recommended PCP.  Patient given coupons for medications and paper prescriptions so that he can go to the [facility] health outpatient pharmacy at [facility] to receive education on the Lantus insulin pen and possibly more resources regarding affording his diabetes  medications.   Patient is concerned regarding possible prostate etiology due to family history of prostate cancer.  Patient continues to deny dribbling and urinary hesitancy.  Recommend follow-up with PCP regarding PSA screening testing and digital rectal exam at wellness visit.  Counseled patient regarding appropriate use of medications and potential side effects for all medications recommended or prescribed today. Discussed red flag signs and symptoms of worsening condition,when to call the PCP office, return to urgent care, and when to seek higher level of care. Patient verbalizes understanding and agreement with plan. All questions answered. Patient discharged  in stable condition.   Final Clinical Impressions(s) / UC Diagnoses   Final diagnoses:  Hyperglycemia  Elevated serum glucose with glucosuria  Asymptomatic microscopic hematuria  Type 2 diabetes mellitus with hyperglycemia, without long-term current use of insulin (HCC)     Discharge Instructions      You were seen in urgent care today for urinary frequency.  You have a lot of sugar in your urine and your blood sugar is very high in the clinic today.  Your symptoms and lab work findings are consistent with type 2 diabetes.  I would like for you to start taking metformin twice daily and use Lantus insulin pen once daily at the same time everyday.  Call the primary care offices listed on your paperwork to schedule an appointment for soon as possible.  Call the urology office to schedule an appointment for soon as possible as well for further evaluation of the blood found in your urine.   If you experience any confusion, dizziness, increased fatigue, or if you begin to feel shaky, please seek medical care.   I have provided a couple of resources and coupons for you to be able to get your medication cheaper.  The [facility] health outpatient pharmacy is an excellent place to pick up your prescriptions as they will provide great education in regards to using your new insulin pen.  [facility] at [facility] [email or web address] [address] [place], [place], [place] [address]  Read over the information regarding diabetes diet and basics in this packet and eat a well balanced diet as discussed.  If you develop any new or worsening symptoms or do not improve in the next 2 to 3 days, please return.  If your symptoms are severe, please go to the emergency room.  Follow-up with your primary care provider for further evaluation and management of your symptoms as well as ongoing wellness visits.  I hope you feel better!     ED Prescriptions     Medication Sig Dispense Auth. Provider    insulin glargine (LANTUS) 100 UNIT/ML injection Inject 0.08 mLs (8 Units total) into the skin daily. 10 mL Reita May M, FNP   metFORMIN (GLUCOPHAGE) 500 MG tablet  (Status: Discontinued) Take 1 tablet (500 mg total) by mouth 2 (two) times daily with a meal. 60 tablet Reita May M, FNP   metFORMIN (GLUCOPHAGE) 500 MG tablet Take 1 tablet (500 mg total) by mouth 2 (two) times daily with a meal. 60 tablet Reita May M, [place]   Insulin Pen Needle (NOVOFINE) 30G X 8 MM MISC  (Status: Discontinued) Inject 10 each into the skin as needed. 100 each Carlisle Beers, FNP   Insulin Pen Needle (NOVOFINE) 30G X 8 MM MISC Inject 10 each into the skin as needed. 100 each Carlisle Beers, FNP      PDMP not reviewed this encounter.   Carlisle Beers, FNP  01/08/22 1110    Carlisle Beers, [place] 01/08/22 1111

## 2022-01-08 NOTE — Discharge Instructions (Addendum)
You were seen in urgent care today for urinary frequency.  You have a lot of sugar in your urine and your blood sugar is very high in the clinic today.  Your symptoms and lab work findings are consistent with type 2 diabetes.  I would like for you to start taking metformin twice daily and use Lantus insulin pen once daily at the same time everyday.  Call the primary care offices listed on your paperwork to schedule an appointment for soon as possible.  Call the urology office to schedule an appointment for soon as possible as well for further evaluation of the blood found in your urine.   If you experience any confusion, dizziness, increased fatigue, or if you begin to feel shaky, please seek medical care.   I have provided a couple of resources and coupons for you to be able to get your medication cheaper.  The [facility] health outpatient pharmacy is an excellent place to pick up your prescriptions as they will provide great education in regards to using your new insulin pen.  [facility] at [facility] [email or web address] [address] [place], [place], [place] [address]  Read over the information regarding diabetes diet and basics in this packet and eat a well balanced diet as discussed.  If you develop any new or worsening symptoms or do not improve in the next 2 to 3 days, please return.  If your symptoms are severe, please go to the emergency room.  Follow-up with your primary care provider for further evaluation and management of your symptoms as well as ongoing wellness visits.  I hope you feel better!

## 2022-01-09 LAB — HEMOGLOBIN A1C
Hgb A1c MFr Bld: >15.5 % — ABNORMAL HIGH (ref 4.8–5.6)
Mean Plasma Glucose: >398 mg/dL

## 2022-01-09 LAB — URINE CULTURE: Culture: NO GROWTH

## 2022-01-19 ENCOUNTER — Encounter: Payer: Self-pay | Admitting: *Deleted

## 2022-04-11 ENCOUNTER — Ambulatory Visit (HOSPITAL_COMMUNITY)
Admission: EM | Admit: 2022-04-11 | Discharge: 2022-04-11 | Disposition: A | Discharge status: Home / Self Care | Payer: Self-pay | Attending: Internal Medicine | Admitting: Internal Medicine

## 2022-04-11 ENCOUNTER — Encounter (HOSPITAL_COMMUNITY): Payer: Self-pay

## 2022-04-11 DIAGNOSIS — Z760 Encounter for issue of repeat prescription: Secondary | ICD-10-CM

## 2022-04-11 DIAGNOSIS — B356 Tinea cruris: Secondary | ICD-10-CM

## 2022-04-11 DIAGNOSIS — Z794 Long term (current) use of insulin: Secondary | ICD-10-CM

## 2022-04-11 DIAGNOSIS — E11628 Type 2 diabetes mellitus with other skin complications: Secondary | ICD-10-CM

## 2022-04-11 MED ORDER — MICONAZOLE NITRATE 2 % EX POWD: CUTANEOUS | 0 refills | Status: DC | PRN

## 2022-04-11 MED ORDER — METFORMIN HCL 500 MG PO TABS: 500.0000 mg | ORAL_TABLET | Freq: Two times a day (BID) | ORAL | 1 refills | Status: DC

## 2022-04-11 MED ORDER — TERBINAFINE HCL 250 MG PO TABS: 250.0000 mg | ORAL_TABLET | Freq: Every day | ORAL | 0 refills | Status: AC

## 2022-04-11 MED ORDER — INSULIN GLARGINE 100 UNIT/ML ~~LOC~~ SOLN: 8.0000 [IU] | Freq: Every day | SUBCUTANEOUS | 11 refills | Status: DC

## 2022-04-11 NOTE — ED Triage Notes (Signed)
Pt is here for pain and irritation to the testicles x 10 days . Pt denies pain when urinating.

## 2022-04-11 NOTE — Discharge Instructions (Addendum)
Please use medications as directed Avoid wearing tight underwear Return to urgent care if you have worsening scrotal itching, scrotal swelling, scrotal redness or any other concerns.  If you have testicular pain please return to urgent care immediately.

## 2022-04-12 NOTE — ED Provider Notes (Signed)
[facility]    CSN: [id number] Arrival date & time: 04/11/22  0805      History   Chief Complaint Chief Complaint  Patient presents with   Testicle Pain    HPI Jay Nunez is a 56 y.o. male to the urgent care complaining of irritation, itching and pain in the groin.  Symptoms started about 10 days ago.  He has applied over-the-counter medications with no improvement in the symptoms.  Patient denies any fever or chills.  No testicular pain.  No dysuria urgency or frequency.  No trauma to the testis or the groin area.  No concerns for STI as per patient.  Patient has applied over-the-counter antifungal agents with no improvement in symptoms.  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 Authorizing Provider  miconazole (MICOTIN) 2 % powder Apply topically as needed for itching. 04/11/22  Yes Rashonda Warrior, Britta Mccreedy, MD  terbinafine (LAMISIL) 250 MG tablet Take 1 tablet (250 mg total) by mouth daily for 10 days. 04/11/22 04/21/22 Yes Ahja Martello, Britta Mccreedy, MD  doxycycline (VIBRA-TABS) 100 MG tablet Take 1 tablet (100 mg total) by mouth 2 (two) times daily. 07/11/20   Elvina Sidle, MD  insulin glargine (LANTUS) 100 UNIT/ML injection Inject 0.08 mLs (8 Units total) into the skin daily. 04/11/22   Merrilee Jansky, MD  metFORMIN (GLUCOPHAGE) 500 MG tablet Take 1 tablet (500 mg total) by mouth 2 (two) times daily with a meal. 04/11/22  06/10/22  Nilton Lave, Britta Mccreedy, MD  Multiple Vitamin (MULTIVITAMIN) capsule Take 1 capsule by mouth daily.      [provider]    Family History Family History  Problem Relation Age of Onset   Cancer Father        prostate    Social History Social History   Tobacco Use   Smoking status: Every Day    Packs/day: 0.25    Types: Cigarettes   Smokeless tobacco: Never  Substance Use Topics   Alcohol use: Yes   Drug use: No     Allergies   Patient has no known allergies.   Review of Systems Review of Systems As per HPI  Physical Exam Triage Vital Signs ED Triage Vitals  Enc Vitals Group     BP 04/11/22 0844 115/79     Pulse Rate 04/11/22 0844 78     Resp 04/11/22 0844 12     Temp 04/11/22 0844 98 F (36.7 C)     Temp Source 04/11/22 0844 Oral     SpO2 --      Weight 04/11/22 0842 170 lb (77.1 kg)     Height 04/11/22 0842 5\' 8"  (1.727 m)     Head Circumference --      Peak Flow --      Pain Score 04/11/22 0840 8  Pain Loc --      Pain Edu? --      Excl. in GC? --    No data found.  Updated Vital Signs BP 115/79 (BP Location: Right Arm)   Pulse 78   Temp 98 F (36.7 C) (Oral)   Resp 12   Ht 5\' 8"  (1.727 m)   Wt 77.1 kg   BMI 25.85 kg/m   Visual Acuity Right Eye Distance:   Left Eye Distance:   Bilateral Distance:    Right Eye Near:   Left Eye Near:    Bilateral Near:     Physical Exam Vitals and nursing note reviewed.  Constitutional:      Appearance: Normal appearance.  Cardiovascular:     Rate and Rhythm: Normal rate and regular rhythm.  Genitourinary:    Penis: Normal.      Testes: Normal.  Musculoskeletal:     Cervical back: Normal range of motion.  Skin:    Comments: Excoriations in the groin region.  Mild maceration in the groin area.  No tenderness on palpation of the testis.  Neurological:     Mental Status: He is alert.      UC Treatments / Results  Labs (all labs ordered are listed, but only abnormal results are  displayed) Labs Reviewed - No data to display  EKG   Radiology No results found.  Procedures Procedures (including critical care time)  Medications Ordered in UC Medications - No data to display  Initial Impression / Assessment and Plan / UC Course  I have reviewed the triage vital signs and the nursing notes.  Pertinent labs & imaging results that were available during my care of the patient were reviewed by me and considered in my medical decision making (see chart for details).     1.  Tinea cruris: Failed topical medications Miconazole powder Lamisil 250 mg daily for 10 days Return precautions given  2.  Encounter for metformin, insulin refill. Medication refill completed Diabetic diet and frequent glucometer monitoring recommended.  Final Clinical Impressions(s) / UC Diagnoses   Final diagnoses:  Tinea cruris  Encounter for medication refill     Discharge Instructions      Please use medications as directed Avoid wearing tight underwear Return to urgent care if you have worsening scrotal itching, scrotal swelling, scrotal redness or any other concerns.  If you have testicular pain please return to urgent care immediately.   ED Prescriptions     Medication Sig Dispense Auth. Provider   miconazole (MICOTIN) 2 % powder Apply topically as needed for itching. 70 g , MD   terbinafine (LAMISIL) 250 MG tablet Take 1 tablet (250 mg total) by mouth daily for 10 days. 10 tablet Kahealani Yankovich, Merrilee Jansky, MD   metFORMIN (GLUCOPHAGE) 500 MG tablet  (Status: Discontinued) Take 1 tablet (500 mg total) by mouth 2 (two) times daily with a meal. 60 tablet Timberlee Roblero, Britta Mccreedy, MD   insulin glargine (LANTUS) 100 UNIT/ML injection  (Status: Discontinued) Inject 0.08 mLs (8 Units total) into the skin daily. 10 mL Ander Wamser, Britta Mccreedy, MD   insulin glargine (LANTUS) 100 UNIT/ML injection Inject 0.08 mLs (8 Units total) into the skin daily. 10 mL Celeste Candelas, Britta Mccreedy, MD    metFORMIN (GLUCOPHAGE) 500 MG tablet Take 1 tablet (500 mg total) by mouth 2 (two) times daily with a meal. 60 tablet Flay Ghosh, Britta Mccreedy, MD      PDMP not reviewed this encounter.   Britta Mccreedy,  MD 04/12/22 1513

## 2022-04-19 ENCOUNTER — Ambulatory Visit (INDEPENDENT_AMBULATORY_CARE_PROVIDER_SITE_OTHER): Payer: Self-pay | Admitting: Primary Care

## 2022-04-30 ENCOUNTER — Other Ambulatory Visit (HOSPITAL_COMMUNITY)
Admission: RE | Admit: 2022-04-30 | Discharge: 2022-04-30 | Disposition: A | Discharge status: Home / Self Care | Payer: Self-pay | Source: Ambulatory Visit | Attending: Internal Medicine | Admitting: Internal Medicine

## 2022-04-30 ENCOUNTER — Encounter: Payer: Self-pay | Admitting: Internal Medicine

## 2022-04-30 ENCOUNTER — Ambulatory Visit: Payer: Self-pay | Attending: Internal Medicine | Admitting: Internal Medicine

## 2022-04-30 VITALS — BP 118/73 | HR 72 | Ht 68.0 in | Wt 175.4 lb

## 2022-04-30 DIAGNOSIS — F17200 Nicotine dependence, unspecified, uncomplicated: Secondary | ICD-10-CM

## 2022-04-30 DIAGNOSIS — E1142 Type 2 diabetes mellitus with diabetic polyneuropathy: Secondary | ICD-10-CM

## 2022-04-30 DIAGNOSIS — G709 Myoneural disorder, unspecified: Secondary | ICD-10-CM

## 2022-04-30 DIAGNOSIS — Z113 Encounter for screening for infections with a predominantly sexual mode of transmission: Secondary | ICD-10-CM | POA: Insufficient documentation

## 2022-04-30 DIAGNOSIS — Z23 Encounter for immunization: Secondary | ICD-10-CM

## 2022-04-30 DIAGNOSIS — Z1331 Encounter for screening for depression: Secondary | ICD-10-CM | POA: Insufficient documentation

## 2022-04-30 DIAGNOSIS — Z7689 Persons encountering health services in other specified circumstances: Secondary | ICD-10-CM

## 2022-04-30 HISTORY — DX: Nicotine dependence, unspecified, uncomplicated: F17.200

## 2022-04-30 LAB — POCT GLYCOSYLATED HEMOGLOBIN (HGB A1C): HbA1c, POC (controlled diabetic range): 8.2 % — AB (ref 0.0–7.0)

## 2022-04-30 LAB — GLUCOSE, POCT (MANUAL RESULT ENTRY): POC Glucose: 219 mg/dl — AB (ref 70–99)

## 2022-04-30 MED ORDER — METFORMIN HCL 500 MG PO TABS: ORAL_TABLET | ORAL | 4 refills | Status: DC

## 2022-04-30 MED ORDER — INSULIN GLARGINE 100 UNIT/ML ~~LOC~~ SOLN: 8.0000 [IU] | Freq: Every day | SUBCUTANEOUS | 11 refills | Status: DC
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
## 2022-04-30 NOTE — Patient Instructions (Signed)
Diabetes Mellitus and Nutrition, Adult When you have diabetes, or diabetes mellitus, it is very important to have healthy eating habits because your blood sugar (glucose) levels are greatly affected by what you eat and drink. Eating healthy foods in the right amounts, at about the same times every day, can help you: Manage your blood glucose. Lower your risk of heart disease. Improve your blood pressure. Reach or maintain a healthy weight. What can affect my meal plan? Every person with diabetes is different, and each person has different needs for a meal plan. Your health care provider may recommend that you work with a dietitian to make a meal plan that is best for you. Your meal plan may vary depending on factors such as: The calories you need. The medicines you take. Your weight. Your blood glucose, blood pressure, and cholesterol levels. Your activity level. Other health conditions you have, such as heart or kidney disease. How do carbohydrates affect me? Carbohydrates, also called carbs, affect your blood glucose level more than any other type of food. Eating carbs raises the amount of glucose in your blood. It is important to know how many carbs you can safely have in each meal. This is different for every person. Your dietitian can help you calculate how many carbs you should have at each meal and for each snack. How does alcohol affect me? Alcohol can cause a decrease in blood glucose (hypoglycemia), especially if you use insulin or take certain diabetes medicines by mouth. Hypoglycemia can be a life-threatening condition. Symptoms of hypoglycemia, such as sleepiness, dizziness, and confusion, are similar to symptoms of having too much alcohol. Do not drink alcohol if: Your health care provider tells you not to drink. You are pregnant, may be pregnant, or are planning to become pregnant. If you drink alcohol: Limit how much you have to: 0-1 drink a day for women. 0-2 drinks a day  for men. Know how much alcohol is in your drink. In the [place] one drink equals one 12 oz bottle of beer (355 mL), one 5 oz glass of wine (148 mL), or one 1 oz glass of hard liquor (44 mL). Keep yourself hydrated with water, diet soda, or unsweetened iced tea. Keep in mind that regular soda, juice, and other mixers may contain a lot of sugar and must be counted as carbs. What are tips for following this plan?  Reading food labels Start by checking the serving size on the Nutrition Facts label of packaged foods and drinks. The number of calories and the amount of carbs, fats, and other nutrients listed on the label are based on one serving of the item. Many items contain more than one serving per package. Check the total grams (g) of carbs in one serving. Check the number of grams of saturated fats and trans fats in one serving. Choose foods that have a low amount or none of these fats. Check the number of milligrams (mg) of salt (sodium) in one serving. Most people should limit total sodium intake to less than 2,300 mg per day. Always check the nutrition information of foods labeled as "low-fat" or "nonfat." These foods may be higher in added sugar or refined carbs and should be avoided. Talk to your dietitian to identify your daily goals for nutrients listed on the label. Shopping Avoid buying canned, pre-made, or processed foods. These foods tend to be high in fat, sodium, and added sugar. Shop around the outside edge of the grocery store. This is where you   will most often find fresh fruits and vegetables, bulk grains, fresh meats, and fresh dairy products. Cooking Use low-heat cooking methods, such as baking, instead of high-heat cooking methods, such as deep frying. Cook using healthy oils, such as olive, canola, or sunflower oil. Avoid cooking with butter, cream, or high-fat meats. Meal planning Eat meals and snacks regularly, preferably at the same times every day. Avoid going long periods  of time without eating. Eat foods that are high in fiber, such as fresh fruits, vegetables, beans, and whole grains. Eat 4-6 oz (112-168 g) of lean protein each day, such as lean meat, chicken, fish, eggs, or tofu. One ounce (oz) (28 g) of lean protein is equal to: 1 oz (28 g) of meat, chicken, or fish. 1 egg.  cup (62 g) of tofu. Eat some foods each day that contain healthy fats, such as avocado, nuts, seeds, and fish. What foods should I eat? Fruits Berries. Apples. Oranges. Peaches. Apricots. Plums. Grapes. Mangoes. Papayas. Pomegranates. Kiwi. Cherries. Vegetables Leafy greens, including lettuce, spinach, kale, chard, collard greens, mustard greens, and cabbage. Beets. Cauliflower. Broccoli. Carrots. Green beans. Tomatoes. Peppers. Onions. Cucumbers. Brussels sprouts. Grains Whole grains, such as whole-wheat or whole-grain bread, crackers, tortillas, cereal, and pasta. Unsweetened oatmeal. Quinoa. Brown or wild rice. Meats and other proteins Seafood. Poultry without skin. Lean cuts of poultry and beef. Tofu. Nuts. Seeds. Dairy Low-fat or fat-free dairy products such as milk, yogurt, and cheese. The items listed above may not be a complete list of foods and beverages you can eat and drink. Contact a dietitian for more information. What foods should I avoid? Fruits Fruits canned with syrup. Vegetables Canned vegetables. Frozen vegetables with butter or cream sauce. Grains Refined white flour and flour products such as bread, pasta, snack foods, and cereals. Avoid all processed foods. Meats and other proteins Fatty cuts of meat. Poultry with skin. Breaded or fried meats. Processed meat. Avoid saturated fats. Dairy Full-fat yogurt, cheese, or milk. Beverages Sweetened drinks, such as soda or iced tea. The items listed above may not be a complete list of foods and beverages you should avoid. Contact a dietitian for more information. Questions to ask a health care provider Do I need  to meet with a certified diabetes care and education specialist? Do I need to meet with a dietitian? What number can I call if I have questions? When are the best times to check my blood glucose? Where to find more information: American Diabetes Association: diabetes.org Academy of Nutrition and Dietetics: eatright.[employer] of Diabetes and Digestive and Kidney Diseases: StageSync.si Association of Diabetes Care & Education Specialists: diabeteseducator.org Summary It is important to have healthy eating habits because your blood sugar (glucose) levels are greatly affected by what you eat and drink. It is important to use alcohol carefully. A healthy meal plan will help you manage your blood glucose and lower your risk of heart disease. Your health care provider may recommend that you work with a dietitian to make a meal plan that is best for you. This information is not intended to replace advice given to you by your health care provider. Make sure you discuss any questions you have with your health care provider.  Diabetes Mellitus and Standards of Medical Care Living with and managing diabetes (diabetes mellitus) can be complicated. Your diabetes treatment may be managed by a team of health care providers, including: A physician who specializes in diabetes (endocrinologist). You might also have visits with a nurse practitioner or  physician assistant. Nurses. A registered dietitian. A certified diabetes care and education specialist. An exercise specialist. A pharmacist. An eye doctor. A foot specialist (podiatrist). A dental care provider. A primary care provider. A mental health care provider. How to manage your diabetes You can do many things to successfully manage your diabetes. Your health care providers will follow guidelines to help you get the best quality of care. Here are general guidelines for your diabetes management plan. Your health care providers may give  you more specific instructions. Physical exams When you are diagnosed with diabetes, and each year after that, your health care provider will ask about your medical and family history. You will have a physical exam, which may include: Measuring your height, weight, and body mass index (BMI). Checking your blood pressure. This will be done at every routine medical visit. Your target blood pressure may vary depending on your medical conditions, your age, and other factors. A thyroid exam. A skin exam. Screening for nerve damage (peripheral neuropathy). This may include checking the pulse in your legs and feet and the level of sensation in your hands and feet. A foot exam to inspect the structure and skin of your feet, including checking for cuts, bruises, redness, blisters, sores, or other problems. Screening for blood vessel (vascular) problems. This may include checking the pulse in your legs and feet and checking your temperature. Blood tests Depending on your treatment plan and your personal needs, you may have the following tests: Hemoglobin A1C (HbA1C). This test provides information about blood sugar (glucose) control over the previous 2-3 months. It is used to adjust your treatment plan, if needed. This test will be done: At least 2 times a year, if you are meeting your treatment goals. 4 times a year, if you are not meeting your treatment goals or if your goals have changed. Lipid testing, including total cholesterol, LDL and HDL cholesterol, and triglyceride levels. The goal for LDL is less than 100 mg/dL (5.5 mmol/L). If you are at high risk for complications, the goal is less than 70 mg/dL (3.9 mmol/L). The goal for HDL is 40 mg/dL (2.2 mmol/L) or higher for men, and 50 mg/dL (2.8 mmol/L) or higher for women. An HDL cholesterol of 60 mg/dL (3.3 mmol/L) or higher gives some protection against heart disease. The goal for triglycerides is less than 150 mg/dL (8.3 mmol/L). Liver function  tests. Kidney function tests. Thyroid function tests.  Dental and eye exams  Visit your dentist two times a year. If you have type 1 diabetes, your health care provider may recommend an eye exam within 5 years after you are diagnosed, and then once a year after your first exam. For children with type 1 diabetes, the health care provider may recommend an eye exam when your child is age 60 or older and has had diabetes for 3-5 years. After the first exam, your child should get an eye exam once a year. If you have type 2 diabetes, your health care provider may recommend an eye exam as soon as you are diagnosed, and then every 1-2 years after your first exam. Immunizations A yearly flu (influenza) vaccine is recommended annually for everyone 6 months or older. This is especially important if you have diabetes. The pneumonia (pneumococcal) vaccine is recommended for everyone 2 years or older who has diabetes. If you are age 15 or older, you may get the pneumonia vaccine as a series of two separate shots. The hepatitis B vaccine is recommended for  adults shortly after being diagnosed with diabetes. Adults and children with diabetes should receive all other vaccines according to age-specific recommendations from the Centers for Disease Control and Prevention (CDC). Mental and emotional health Screening for symptoms of eating disorders, anxiety, and depression is recommended at the time of diagnosis and after as needed. If your screening shows that you have symptoms, you may need more evaluation. You may work with a mental health care provider. Follow these instructions at home: Treatment plan You will monitor your blood glucose levels and may give yourself insulin. Your treatment plan will be reviewed at every medical visit. You and your health care provider will discuss: How you are taking your medicines, including insulin. Any side effects you have. Your blood glucose level target goals. How often  you monitor your blood glucose level. Lifestyle habits, such as activity level and tobacco, alcohol, and substance use. Education Your health care provider will assess how well you are monitoring your blood glucose levels and whether you are taking your insulin and medicines correctly. He or she may refer you to: A certified diabetes care and education specialist to manage your diabetes throughout your life, starting at diagnosis. A registered dietitian who can create and review your personal nutrition plan. An exercise specialist who can discuss your activity level and exercise plan. General instructions Take over-the-counter and prescription medicines only as told by your health care provider. Keep all follow-up visits. This is important. Where to find support There are many diabetes support networks, including: American Diabetes Association (ADA): diabetes.org Defeat Diabetes Foundation: defeatdiabetes.org Where to find more information American Diabetes Association (ADA): www.diabetes.org Association of Diabetes Care & Education Specialists (ADCES): diabeteseducator.org International Diabetes Federation (IDF): https://www.munoz-bell.org/ Summary Managing diabetes (diabetes mellitus) can be complicated. Your diabetes treatment may be managed by a team of health care providers. Your health care providers follow guidelines to help you get the best quality care. You should have physical exams, blood tests, blood pressure monitoring, immunizations, and screening tests regularly. Stay updated on how to manage your diabetes. Your health care providers may also give you more specific instructions based on your individual health. This information is not intended to replace advice given to you by your health care provider. Make sure you discuss any questions you have with your health care provider. Document Revised: 01/28/2020 Document Reviewed: 01/28/2020 Elsevier Patient Education  [place]  Revised: 02/24/2020 Document Reviewed: 02/24/2020 Elsevier Patient Education  [place].

## 2022-04-30 NOTE — Progress Notes (Signed)
Needs to get eyes checked.

## 2022-05-01 ENCOUNTER — Other Ambulatory Visit: Payer: Self-pay | Admitting: Internal Medicine

## 2022-05-01 LAB — COMPREHENSIVE METABOLIC PANEL
ALT: 21 IU/L (ref 0–44)
AST: 18 IU/L (ref 0–40)
Albumin/Globulin Ratio: 2 (ref 1.2–2.2)
Albumin: 4.7 g/dL (ref 3.8–4.9)
Alkaline Phosphatase: 54 IU/L (ref 44–121)
BUN/Creatinine Ratio: 11 (ref 9–20)
BUN: 11 mg/dL (ref 6–24)
Bilirubin Total: 0.4 mg/dL (ref 0.0–1.2)
CO2: 21 mmol/L (ref 20–29)
Calcium: 9.9 mg/dL (ref 8.7–10.2)
Chloride: 104 mmol/L (ref 96–106)
Creatinine, Ser: 0.99 mg/dL (ref 0.76–1.27)
Globulin, Total: 2.3 g/dL (ref 1.5–4.5)
Glucose: 196 mg/dL — ABNORMAL HIGH (ref 70–99)
Potassium: 4.5 mmol/L (ref 3.5–5.2)
Sodium: 141 mmol/L (ref 134–144)
Total Protein: 7 g/dL (ref 6.0–8.5)
eGFR: 90 mL/min/{1.73_m2} (ref >59)

## 2022-05-01 LAB — LIPID PANEL
Chol/HDL Ratio: 4.3 ratio (ref 0.0–5.0)
Cholesterol, Total: 166 mg/dL (ref 100–199)
HDL: 39 mg/dL — ABNORMAL LOW (ref >39)
LDL Chol Calc (NIH): 113 mg/dL — ABNORMAL HIGH (ref 0–99)
Triglycerides: 70 mg/dL (ref 0–149)
VLDL Cholesterol Cal: 14 mg/dL (ref 5–40)

## 2022-05-01 LAB — CBC
Hematocrit: 40.7 % (ref 37.5–51.0)
Hemoglobin: 14 g/dL (ref 13.0–17.7)
MCH: 30.9 pg (ref 26.6–33.0)
MCHC: 34.4 g/dL (ref 31.5–35.7)
MCV: 90 fL (ref 79–97)
Platelets: 172 10*3/uL (ref 150–450)
RBC: 4.53 x10E6/uL (ref 4.14–5.80)
RDW: 11.6 % (ref 11.6–15.4)
WBC: 2.9 10*3/uL — ABNORMAL LOW (ref 3.4–10.8)

## 2022-05-01 LAB — URINE CYTOLOGY ANCILLARY ONLY
Chlamydia: NEGATIVE
Comment: NEGATIVE
Comment: NEGATIVE
Comment: NORMAL
Neisseria Gonorrhea: NEGATIVE
Trichomonas: NEGATIVE

## 2022-05-01 LAB — HIV ANTIBODY (ROUTINE TESTING W REFLEX): HIV Screen 4th Generation wRfx: NONREACTIVE

## 2022-05-01 LAB — HCV INTERPRETATION

## 2022-05-01 LAB — HCV AB W REFLEX TO QUANT PCR: HCV Ab: NONREACTIVE

## 2022-05-01 LAB — MICROALBUMIN / CREATININE URINE RATIO
Creatinine, Urine: 64.1 mg/dL
Microalb/Creat Ratio: <5 mg/g creat (ref 0–29)
Microalbumin, Urine: <3 ug/mL

## 2022-05-01 LAB — RPR: RPR Ser Ql: NONREACTIVE

## 2022-05-01 MED ORDER — ATORVASTATIN CALCIUM 10 MG PO TABS
10.0000 mg | ORAL_TABLET | Freq: Every day | ORAL | 3 refills | Status: DC
  Filled 2022-05-01: qty 30, 30d supply, fill #0

## 2022-05-01 MED ORDER — ATORVASTATIN CALCIUM 10 MG PO TABS: 10.0000 mg | ORAL_TABLET | Freq: Every day | ORAL | 3 refills | Status: DC

## 2022-05-02 ENCOUNTER — Other Ambulatory Visit: Payer: Self-pay

## 2022-05-08 ENCOUNTER — Other Ambulatory Visit: Payer: Self-pay

## 2022-06-12 ENCOUNTER — Ambulatory Visit: Payer: Self-pay | Admitting: Internal Medicine

## 2022-07-02 ENCOUNTER — Ambulatory Visit: Payer: Self-pay | Attending: Internal Medicine | Admitting: Internal Medicine

## 2022-07-02 VITALS — BP 122/78 | HR 69 | Temp 98.5°F | Ht 68.0 in | Wt 176.0 lb

## 2022-07-02 DIAGNOSIS — H269 Unspecified cataract: Secondary | ICD-10-CM

## 2022-07-02 DIAGNOSIS — Z87891 Personal history of nicotine dependence: Secondary | ICD-10-CM

## 2022-07-02 DIAGNOSIS — H00029 Hordeolum internum unspecified eye, unspecified eyelid: Secondary | ICD-10-CM

## 2022-07-02 DIAGNOSIS — Z1211 Encounter for screening for malignant neoplasm of colon: Secondary | ICD-10-CM

## 2022-07-02 DIAGNOSIS — E785 Hyperlipidemia, unspecified: Secondary | ICD-10-CM

## 2022-07-02 DIAGNOSIS — E1169 Type 2 diabetes mellitus with other specified complication: Secondary | ICD-10-CM

## 2022-07-02 LAB — GLUCOSE, POCT (MANUAL RESULT ENTRY): POC Glucose: 179 mg/dl — AB (ref 70–99)

## 2022-07-02 LAB — POCT GLYCOSYLATED HEMOGLOBIN (HGB A1C): HbA1c, POC (controlled diabetic range): 6.4 % (ref 0.0–7.0)
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
## 2022-07-02 NOTE — Patient Instructions (Signed)
Let me know once approved for the [facility] Discount so that we can refer you to have your colonoscopy done.

## 2022-07-03 ENCOUNTER — Encounter: Payer: Self-pay | Admitting: Internal Medicine

## 2022-07-03 ENCOUNTER — Other Ambulatory Visit: Payer: Self-pay | Admitting: Internal Medicine

## 2022-07-03 DIAGNOSIS — Z1211 Encounter for screening for malignant neoplasm of colon: Secondary | ICD-10-CM

## 2022-07-05 LAB — FECAL OCCULT BLOOD, IMMUNOCHEMICAL: Fecal Occult Bld: NEGATIVE

## 2022-12-05 HISTORY — PX: CATARACT EXTRACTION: SUR2

## 2023-01-07 ENCOUNTER — Encounter: Payer: Self-pay | Admitting: Internal Medicine

## 2023-01-07 ENCOUNTER — Ambulatory Visit: Payer: Self-pay | Attending: Internal Medicine | Admitting: Internal Medicine

## 2023-01-07 VITALS — BP 128/77 | HR 67 | Temp 98.6°F | Ht 68.0 in | Wt 175.0 lb

## 2023-01-07 DIAGNOSIS — E785 Hyperlipidemia, unspecified: Secondary | ICD-10-CM

## 2023-01-07 DIAGNOSIS — E1169 Type 2 diabetes mellitus with other specified complication: Secondary | ICD-10-CM

## 2023-01-07 DIAGNOSIS — Z2821 Immunization not carried out because of patient refusal: Secondary | ICD-10-CM

## 2023-01-07 DIAGNOSIS — F17210 Nicotine dependence, cigarettes, uncomplicated: Secondary | ICD-10-CM

## 2023-01-07 DIAGNOSIS — Z794 Long term (current) use of insulin: Secondary | ICD-10-CM

## 2023-01-07 LAB — POCT GLYCOSYLATED HEMOGLOBIN (HGB A1C): HbA1c, POC (controlled diabetic range): 7.8 % — AB (ref 0.0–7.0)

## 2023-01-07 LAB — GLUCOSE, POCT (MANUAL RESULT ENTRY): POC Glucose: 160 mg/dl — AB (ref 70–99)

## 2023-01-07 MED ORDER — INSULIN GLARGINE 100 UNIT/ML ~~LOC~~ SOLN: SUBCUTANEOUS | 11 refills | Status: DC

## 2023-01-07 MED ORDER — METFORMIN HCL 500 MG PO TABS: ORAL_TABLET | ORAL | 1 refills | Status: DC
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
## 2023-01-09 LAB — LIPID PANEL
Chol/HDL Ratio: 4.3 ratio (ref 0.0–5.0)
Cholesterol, Total: 188 mg/dL (ref 100–199)
HDL: 44 mg/dL (ref >39)
LDL Chol Calc (NIH): 133 mg/dL — ABNORMAL HIGH (ref 0–99)
Triglycerides: 56 mg/dL (ref 0–149)
VLDL Cholesterol Cal: 11 mg/dL (ref 5–40)

## 2023-05-13 ENCOUNTER — Encounter: Payer: Self-pay | Admitting: Internal Medicine

## 2023-05-13 ENCOUNTER — Ambulatory Visit: Payer: Self-pay | Attending: Internal Medicine | Admitting: Internal Medicine

## 2023-05-13 VITALS — BP 110/74 | HR 75 | Temp 98.2°F | Ht 68.0 in | Wt 180.0 lb

## 2023-05-13 DIAGNOSIS — N529 Male erectile dysfunction, unspecified: Secondary | ICD-10-CM | POA: Insufficient documentation

## 2023-05-13 DIAGNOSIS — Z87891 Personal history of nicotine dependence: Secondary | ICD-10-CM

## 2023-05-13 DIAGNOSIS — E1169 Type 2 diabetes mellitus with other specified complication: Secondary | ICD-10-CM

## 2023-05-13 DIAGNOSIS — Z794 Long term (current) use of insulin: Secondary | ICD-10-CM

## 2023-05-13 DIAGNOSIS — Z5320 Procedure and treatment not carried out because of patient's decision for unspecified reasons: Secondary | ICD-10-CM

## 2023-05-13 DIAGNOSIS — Z7984 Long term (current) use of oral hypoglycemic drugs: Secondary | ICD-10-CM

## 2023-05-13 DIAGNOSIS — Z2821 Immunization not carried out because of patient refusal: Secondary | ICD-10-CM

## 2023-05-13 DIAGNOSIS — E785 Hyperlipidemia, unspecified: Secondary | ICD-10-CM

## 2023-05-13 DIAGNOSIS — E119 Type 2 diabetes mellitus without complications: Secondary | ICD-10-CM

## 2023-05-13 LAB — POCT GLYCOSYLATED HEMOGLOBIN (HGB A1C): HbA1c, POC (controlled diabetic range): 7.4 % — AB (ref 0.0–7.0)

## 2023-05-13 LAB — GLUCOSE, POCT (MANUAL RESULT ENTRY): POC Glucose: 118 mg/dL — AB (ref 70–99)

## 2023-05-13 MED ORDER — METFORMIN HCL 1000 MG PO TABS
1000.0000 mg | ORAL_TABLET | Freq: Two times a day (BID) | ORAL | 2 refills | Status: DC
Start: 2023-05-13 — End: 2023-11-11

## 2023-05-13 MED ORDER — METFORMIN HCL 1000 MG PO TABS
1000.0000 mg | ORAL_TABLET | Freq: Two times a day (BID) | ORAL | 2 refills | Status: DC
Start: 2023-05-13 — End: 2023-05-13

## 2023-05-13 MED ORDER — SILDENAFIL CITRATE 50 MG PO TABS: ORAL_TABLET | ORAL | 2 refills | Status: DC
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
## 2023-05-13 NOTE — Patient Instructions (Signed)
Increase metformin to 1000 mg twice a day.  I have sent an updated prescription to [facility] pharmacy for the 1000 mg tablets.  Use the Viagra as needed half hour to 1 hour prior to intercourse.

## 2023-05-14 LAB — MICROALBUMIN / CREATININE URINE RATIO
Creatinine, Urine: 166 mg/dL
Microalb/Creat Ratio: 3 mg/g{creat} (ref 0–29)
Microalbumin, Urine: 5 ug/mL

## 2023-07-05 ENCOUNTER — Other Ambulatory Visit: Payer: Self-pay | Admitting: Internal Medicine

## 2023-07-05 DIAGNOSIS — E1169 Type 2 diabetes mellitus with other specified complication: Secondary | ICD-10-CM

## 2023-09-17 ENCOUNTER — Ambulatory Visit: Payer: Self-pay | Admitting: Internal Medicine

## 2023-09-24 LAB — HM DIABETES EYE EXAM

## 2023-09-25 ENCOUNTER — Encounter: Payer: Self-pay | Admitting: Internal Medicine

## 2023-10-23 ENCOUNTER — Ambulatory Visit: Payer: Self-pay | Admitting: Internal Medicine

## 2023-10-24 ENCOUNTER — Ambulatory Visit: Payer: Self-pay | Admitting: Internal Medicine

## 2023-11-11 ENCOUNTER — Ambulatory Visit: Payer: Self-pay | Attending: Family Medicine | Admitting: Family Medicine

## 2023-11-11 VITALS — BP 126/70 | HR 62 | Ht 68.0 in | Wt 177.8 lb

## 2023-11-11 DIAGNOSIS — M1612 Unilateral primary osteoarthritis, left hip: Secondary | ICD-10-CM

## 2023-11-11 DIAGNOSIS — E119 Type 2 diabetes mellitus without complications: Secondary | ICD-10-CM

## 2023-11-11 DIAGNOSIS — Z5320 Procedure and treatment not carried out because of patient's decision for unspecified reasons: Secondary | ICD-10-CM

## 2023-11-11 DIAGNOSIS — Z794 Long term (current) use of insulin: Secondary | ICD-10-CM

## 2023-11-11 DIAGNOSIS — R21 Rash and other nonspecific skin eruption: Secondary | ICD-10-CM

## 2023-11-11 DIAGNOSIS — E1169 Type 2 diabetes mellitus with other specified complication: Secondary | ICD-10-CM

## 2023-11-11 DIAGNOSIS — M25552 Pain in left hip: Secondary | ICD-10-CM

## 2023-11-11 DIAGNOSIS — Z7984 Long term (current) use of oral hypoglycemic drugs: Secondary | ICD-10-CM

## 2023-11-11 LAB — POCT GLYCOSYLATED HEMOGLOBIN (HGB A1C): HbA1c, POC (controlled diabetic range): 8 % — AB (ref 0.0–7.0)

## 2023-11-11 MED ORDER — METFORMIN HCL 1000 MG PO TABS
1000.0000 mg | ORAL_TABLET | Freq: Two times a day (BID) | ORAL | 1 refills | Status: DC
Start: 2023-11-11 — End: 2024-06-16

## 2023-11-11 MED ORDER — INSULIN GLARGINE 100 UNIT/ML ~~LOC~~ SOLN: SUBCUTANEOUS | 3 refills | Status: DC

## 2023-11-11 NOTE — Progress Notes (Signed)
 Subjective:  Patient ID: Jay Nunez, male    DOB: [birth year]  Age: 58 y.o. MRN: [id number]  CC: Diabetes (Hip pain/Rash on left buttocks.)     Discussed the use of AI scribe software for clinical note transcription with the patient, who gave verbal consent to proceed.  History of Present Illness Jay Nunez, a 58 year old male with a history of type two diabetes mellitus, nicotine dependence, and a neuromuscular disorder, presents with a rash on his left buttock. The patient has been experiencing pain and stiffness from his left hip to his left thigh for about a month. The pain usually subsides after a shower but has been constant in the mornings. The patient noticed a rash on his left buttock about a week ago, which he describes as itchy in the mornings.  Lesion is not painful and he has no burning at the site.  Denies presence of similar lesions in the past.  The patient has been applying an over-the-counter jock itch cream to the rash, which has helped to alleviate the symptoms.  The patient has a history of diabetes and is currently on metformin. However, his A1c is 8.0, indicating that the metformin alone is not controlling his diabetes. The patient has been on Lantus before but stopped taking it on his own. He is currently not on a statin and continues to decline statin initiation.    Past Medical History:  Diagnosis Date   Diabetes mellitus without complication [facility]) June 2023   Hemorrhoid    Neuromuscular disorder (HCC)    Scrotal abscess    Tobacco dependence 04/30/2022    Past Surgical History:  Procedure Laterality Date   CATARACT EXTRACTION Bilateral    12/2022 and LT in 03/2023   INCISE AND DRAIN ABCESS     ingrown hair on testicle   ULNAR NERVE TRANSPOSITION     left    Family History  Problem Relation Age of Onset   Cancer Father        prostate    Social History   Socioeconomic History   Marital status: Single    Spouse name: Not on file   Number of  children: Not on file   Years of education: Not on file   Highest education level: Bachelor's degree (e.g., BA, AB, BS)  Occupational History   Not on file  Tobacco Use   Smoking status: Former    Types: Cigars   Smokeless tobacco: Never  Substance and Sexual Activity   Alcohol use: Yes    Alcohol/week: 5.0 standard drinks of alcohol    Types: 3 Glasses of wine, 2 Cans of beer per week   Drug use: No   Sexual activity: Not on file  Other Topics Concern   Not on file  Social History Narrative   Not on file   Social Drivers of Health   Financial Resource Strain: Low Risk  (11/11/2023)   Overall Financial Resource Strain (CARDIA)    Difficulty of Paying Living Expenses: Not very hard  Food Insecurity: No Food Insecurity (11/11/2023)   Hunger Vital Sign    Worried About Running Out of Food in the Last Year: Never true    Ran Out of Food in the Last Year: Never true  Transportation Needs: No Transportation Needs (11/11/2023)   PRAPARE - Administrator, Civil Service (Medical): No    Lack of Transportation (Non-Medical): No  Physical Activity: Sufficiently Active (11/11/2023)   Exercise Vital Sign    Days  of Exercise per Week: 3 days    Minutes of Exercise per Session: 60 min  Stress: No Stress Concern Present (11/11/2023)   [employer] of Occupational Health - Occupational Stress Questionnaire    Feeling of Stress : Not at all  Social Connections: Moderately Integrated (11/11/2023)   Social Connection and Isolation Panel [NHANES]    Frequency of Communication with Friends and Family: Twice a week    Frequency of Social Gatherings with Friends and Family: Once a week    Attends Religious Services: 1 to 4 times per year    Active Member of [employer] or Organizations: Yes    Attends Banker Meetings: 1 to 4 times per year    Marital Status: Never married    No Known Allergies  Outpatient Medications Prior to Visit  Medication Sig Dispense Refill   metFORMIN  (GLUCOPHAGE) 1000 MG tablet Take 1 tablet (1,000 mg total) by mouth 2 (two) times daily with a meal. 180 tablet 2   sildenafil (VIAGRA) 50 MG tablet 1 tab 1/2 hr to 1 hr prior to intercourse PRN.  Limit use to 1 tab/24 hrs 15 tablet 2   insulin glargine (LANTUS) 100 UNIT/ML injection 9-10 units daily with breakfast 10 mL 11   No facility-administered medications prior to visit.     ROS Review of Systems  Constitutional:  Negative for activity change and appetite change.  HENT:  Negative for sinus pressure and sore throat.   Respiratory:  Negative for chest tightness, shortness of breath and wheezing.   Cardiovascular:  Negative for chest pain and palpitations.  Gastrointestinal:  Negative for abdominal distention, abdominal pain and constipation.  Genitourinary: Negative.   Musculoskeletal:        See HPI  Skin:  Positive for rash.  Psychiatric/Behavioral:  Negative for behavioral problems and dysphoric mood.     Objective:  BP 126/70   Pulse 62   Ht 5\' 8"  (1.727 m)   Wt 177 lb 12.8 oz (80.6 kg)   SpO2 98%   BMI 27.03 kg/m      11/11/2023    9:13 AM 05/13/2023   11:02 AM 01/07/2023    9:51 AM  BP/Weight  Systolic BP 126 110 128  Diastolic BP 70 74 77  Wt. (Lbs) 177.8 180 175  BMI 27.03 kg/m2 27.37 kg/m2 26.61 kg/m2      Physical Exam Constitutional:      Appearance: He is well-developed.  Cardiovascular:     Rate and Rhythm: Normal rate.     Heart sounds: Normal heart sounds. No murmur heard. Pulmonary:     Effort: Pulmonary effort is normal.     Breath sounds: Normal breath sounds. No wheezing or rales.  Chest:     Chest wall: No tenderness.  Abdominal:     General: Bowel sounds are normal. There is no distension.     Palpations: Abdomen is soft. There is no mass.     Tenderness: There is no abdominal tenderness.  Musculoskeletal:     Right lower leg: No edema.     Left lower leg: No edema.     Comments: No tenderness on palpation of bilateral hip joint and  on range of motion of bilateral hip joint Negative straight leg raise bilaterally  Skin:    Comments: Single nodule in left buttock with superficial scarring, no drainage.  No surrounding erythema, no hypersensitivity of skin  Neurological:     Mental Status: He is alert and oriented to person,  place, and time.  Psychiatric:        Mood and Affect: Mood normal.        Latest Ref Rng & Units 04/30/2022    9:46 AM 01/08/2022   10:42 AM  CMP  Glucose 70 - 99 mg/dL 295  621   BUN 6 - 24 mg/dL 11  13   Creatinine 3.08 - 1.27 mg/dL 6.57  8.46   Sodium 962 - 144 mmol/L 141  138   Potassium 3.5 - 5.2 mmol/L 4.5  4.8   Chloride 96 - 106 mmol/L 104  104   CO2 20 - 29 mmol/L 21  26   Calcium 8.7 - 10.2 mg/dL 9.9  9.9   Total Protein 6.0 - 8.5 g/dL 7.0  7.1   Total Bilirubin 0.0 - 1.2 mg/dL 0.4  0.7   Alkaline Phos 44 - 121 IU/L 54  77   AST 0 - 40 IU/L 18  22   ALT 0 - 44 IU/L 21  22     Lipid Panel     Component Value Date/Time   CHOL 188 01/08/2023 0947   TRIG 56 01/08/2023 0947   HDL 44 01/08/2023 0947   CHOLHDL 4.3 01/08/2023 0947   LDLCALC 133 (H) 01/08/2023 0947    CBC    Component Value Date/Time   WBC 2.9 (L) 04/30/2022 0946   WBC 3.9 (L) 01/08/2022 1042   RBC 4.53 04/30/2022 0946   RBC 4.80 01/08/2022 1042   HGB 14.0 04/30/2022 0946   HCT 40.7 04/30/2022 0946   PLT 172 04/30/2022 0946   MCV 90 04/30/2022 0946   MCH 30.9 04/30/2022 0946   MCH 31.7 01/08/2022 1042   MCHC 34.4 04/30/2022 0946   MCHC 33.3 01/08/2022 1042   RDW 11.6 04/30/2022 0946    Lab Results  Component Value Date   HGBA1C 8.0 (A) 11/11/2023    The 10-year ASCVD risk score (Arnett DK, et al., 2019) is: 12.8%   Values used to calculate the score:     Age: 70 years     Sex: Male     Is Non-Hispanic African American: Yes     Diabetic: Yes     Tobacco smoker: No     Systolic Blood Pressure: 126 mmHg     Is BP treated: No     HDL Cholesterol: 44 mg/dL     Total Cholesterol: 188 mg/dL      Assessment & Plan Rash on left buttock Single bump healing and scarring, likely a boil she is healing - No treatment necessary.  Type 2 Diabetes Mellitus A1c 8.0%, suboptimal control. Stopped Lantus on his own, resuming necessary to prevent A1c increase. - Refill Lantus and metformin prescriptions. - Encourage resumption of Lantus. -Counseled on Diabetic diet, my plate method, 952 minutes of moderate intensity exercise/week Blood sugar logs with fasting goals of 80-120 mg/dl, random of less than 841 and in the event of sugars less than 60 mg/dl or greater than 324 mg/dl encouraged to notify the clinic. Advised on the need for annual eye exams, annual foot exams, Pneumonia vaccine.   Osteoarthritis Morning stiffness in left hip, no significant pain requiring medication. - Recommend over-the-counter ibuprofen as needed.  Declines initiation of statin Increased risk of cardiovascular disease-10-year ASCVD risk of 12.8% Declined cholesterol medication. Benefits of medication in reducing heart attack and stroke risk emphasized.  Colorectal Cancer Screening Due for screening, delayed due to financial constraints. - Discuss options when insurance or financial situation allows.  Follow-up Ongoing management of diabetes and other health concerns needed. - Schedule follow-up appointment with Dr. Laural Benes.      Meds ordered this encounter  Medications   metFORMIN (GLUCOPHAGE) 1000 MG tablet    Sig: Take 1 tablet (1,000 mg total) by mouth 2 (two) times daily with a meal.    Dispense:  180 tablet    Refill:  1   insulin glargine (LANTUS) 100 UNIT/ML injection    Sig: 9-10 units daily with breakfast    Dispense:  10 mL    Refill:  3    Follow-up: Return in about 3 months (around 02/10/2024) for Medical conditions with PCP.       Hoy Register, MD, FAAFP. [facility] [place], [place] [phone]   11/11/2023, 10:09 AM

## 2023-11-11 NOTE — Patient Instructions (Signed)
 VISIT SUMMARY:  During your visit, we discussed several health concerns including a rash on your left buttock, your diabetes management, morning stiffness in your hip, and the need for colorectal cancer screening. We also reviewed your cholesterol levels and the benefits of medication to manage them.  YOUR PLAN:  -RASH ON LEFT BUTTOCK: The rash on your left buttock appears to be a healing boil, which is a type of skin infection. No further treatment is necessary at this time as it is healing and scarring on its own.  -TYPE 2 DIABETES MELLITUS: Your A1c level is 8.0%, indicating that your blood sugar is not well-controlled. We will resume your Lantus prescription along with metformin to help manage your blood sugar levels more effectively. It is important to take your medications as prescribed to prevent complications.  -OSTEOARTHRITIS: The stiffness in your left hip in the mornings is likely due to osteoarthritis, a condition where the protective cartilage that cushions the ends of your bones wears down over time. You can take over-the-counter ibuprofen as needed to help with the stiffness.  -HYPERLIPIDEMIA: Hyperlipidemia means you have high levels of cholesterol in your blood, which can increase your risk of heart disease and stroke. Although you have declined medication, it is important to understand the benefits of taking it to reduce these risks.  -COLORECTAL CANCER SCREENING: You are due for colorectal cancer screening, which is important for early detection of cancer. We will discuss your options for screening when your insurance or financial situation allows.  INSTRUCTIONS:  Please schedule a follow-up appointment with Dr. Laural Benes to continue managing your diabetes and other health concerns. It is important to monitor your condition and adjust your treatment plan as needed.

## 2023-11-12 LAB — MICROALBUMIN / CREATININE URINE RATIO

## 2023-11-12 LAB — LP+NON-HDL CHOLESTEROL

## 2023-11-13 ENCOUNTER — Encounter: Payer: Self-pay | Admitting: Family Medicine

## 2023-11-14 LAB — CMP14+EGFR
ALT: 35 IU/L (ref 0–44)
AST: 22 IU/L (ref 0–40)
Albumin: 4.8 g/dL (ref 3.8–4.9)
Alkaline Phosphatase: 72 IU/L (ref 44–121)
BUN/Creatinine Ratio: 14 (ref 9–20)
BUN: 14 mg/dL (ref 6–24)
Bilirubin Total: 0.3 mg/dL (ref 0.0–1.2)
CO2: 21 mmol/L (ref 20–29)
Calcium: 10.1 mg/dL (ref 8.7–10.2)
Chloride: 100 mmol/L (ref 96–106)
Creatinine, Ser: 1.02 mg/dL (ref 0.76–1.27)
Globulin, Total: 2.5 g/dL (ref 1.5–4.5)
Glucose: 163 mg/dL — ABNORMAL HIGH (ref 70–99)
Potassium: 4.7 mmol/L (ref 3.5–5.2)
Sodium: 137 mmol/L (ref 134–144)
Total Protein: 7.3 g/dL (ref 6.0–8.5)
eGFR: 86 mL/min/{1.73_m2} (ref >59)

## 2023-11-14 LAB — LP+NON-HDL CHOLESTEROL
Cholesterol, Total: 178 mg/dL (ref 100–199)
HDL: 35 mg/dL — ABNORMAL LOW (ref >39)
LDL Chol Calc (NIH): 129 mg/dL — ABNORMAL HIGH (ref 0–99)
Total Non-HDL-Chol (LDL+VLDL): 143 mg/dL — ABNORMAL HIGH (ref 0–129)
Triglycerides: 73 mg/dL (ref 0–149)
VLDL Cholesterol Cal: 14 mg/dL (ref 5–40)

## 2023-11-14 LAB — MICROALBUMIN / CREATININE URINE RATIO

## 2023-11-14 LAB — C-REACTIVE PROTEIN

## 2023-11-14 LAB — SPECIMEN STATUS REPORT

## 2023-11-14 LAB — SEDIMENTATION RATE: Sed Rate: 15 mm/h (ref 0–30)

## 2024-02-11 ENCOUNTER — Ambulatory Visit: Payer: Self-pay | Attending: Internal Medicine | Admitting: Internal Medicine

## 2024-02-11 ENCOUNTER — Other Ambulatory Visit: Payer: Self-pay

## 2024-02-11 VITALS — BP 118/75 | HR 77 | Temp 98.0°F | Ht 68.0 in | Wt 174.0 lb

## 2024-02-11 DIAGNOSIS — Z23 Encounter for immunization: Secondary | ICD-10-CM

## 2024-02-11 DIAGNOSIS — Z7984 Long term (current) use of oral hypoglycemic drugs: Secondary | ICD-10-CM

## 2024-02-11 DIAGNOSIS — Z1211 Encounter for screening for malignant neoplasm of colon: Secondary | ICD-10-CM

## 2024-02-11 DIAGNOSIS — E785 Hyperlipidemia, unspecified: Secondary | ICD-10-CM

## 2024-02-11 DIAGNOSIS — Z5320 Procedure and treatment not carried out because of patient's decision for unspecified reasons: Secondary | ICD-10-CM

## 2024-02-11 DIAGNOSIS — E1169 Type 2 diabetes mellitus with other specified complication: Secondary | ICD-10-CM

## 2024-02-11 DIAGNOSIS — Z794 Long term (current) use of insulin: Secondary | ICD-10-CM

## 2024-02-11 LAB — POCT GLYCOSYLATED HEMOGLOBIN (HGB A1C): HbA1c, POC (controlled diabetic range): 7 % (ref 0.0–7.0)

## 2024-02-11 LAB — GLUCOSE, POCT (MANUAL RESULT ENTRY): POC Glucose: 143 mg/dL — AB (ref 70–99)

## 2024-02-11 MED ORDER — TETANUS-DIPHTH-ACELL PERTUSSIS 5-2-15.5 LF-MCG/0.5 IM SUSP
0.5000 mL | Freq: Once | INTRAMUSCULAR | 0 refills | Status: AC
Start: 2024-02-11 — End: 2024-02-11

## 2024-02-11 MED ORDER — TETANUS-DIPHTH-ACELL PERTUSSIS 5-2-15.5 LF-MCG/0.5 IM SUSP
0.5000 mL | INTRAMUSCULAR | 0 refills | Status: DC
  Filled 2024-02-11: qty 0.5, 1d supply, fill #0
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
## 2024-02-11 NOTE — Patient Instructions (Signed)
 VISIT SUMMARY:  Jay Nunez, a 58 year old male with diabetes and hyperlipidemia, came in for a follow-up visit. His diabetes management has improved significantly, and his blood sugar levels are within the target range. We discussed his elevated LDL cholesterol and the potential for starting statin therapy. Additionally, we addressed his need for routine health maintenance, including vaccinations and cancer screening.  YOUR PLAN:  -TYPE 2 DIABETES MELLITUS: Type 2 diabetes is a condition where your body does not use insulin  properly, leading to high blood sugar levels. Your A1c has improved to 7%, and your blood sugar levels are within the target range. Continue taking metformin  1000 mg twice daily and Lantus  insulin  9 to 10 units daily. Keep up with your healthy eating and regular exercise, and monitor your blood glucose levels daily.  -HYPERLIPIDEMIA: Hyperlipidemia means you have high levels of fats (lipids) in your blood, which can increase your risk of heart disease. Your LDL cholesterol is elevated at 129 mg/dL. We discussed starting pravastatin 20 mg daily at bedtime to help lower your cholesterol, as it has a lower risk of muscle cramps. If you start this medication, we will monitor your liver function tests.  -GENERAL HEALTH MAINTENANCE: You are due for colon cancer screening and vaccinations, including tetanus, pneumonia, and shingles. Today, you will receive the pneumonia vaccine and a prescription for the tetanus vaccine to be administered at the pharmacy. You will also receive a stool test kit for colon cancer screening.  INSTRUCTIONS:  Please schedule a follow-up appointment in four months. If any issues arise before then, contact us  immediately.

## 2024-02-12 ENCOUNTER — Ambulatory Visit: Payer: Self-pay | Admitting: Internal Medicine

## 2024-02-12 LAB — FECAL OCCULT BLOOD, IMMUNOCHEMICAL: Fecal Occult Bld: NEGATIVE

## 2024-06-15 ENCOUNTER — Other Ambulatory Visit: Payer: Self-pay | Admitting: Family Medicine

## 2024-06-15 DIAGNOSIS — E1169 Type 2 diabetes mellitus with other specified complication: Secondary | ICD-10-CM

## 2024-08-13 ENCOUNTER — Encounter: Payer: Self-pay | Admitting: Internal Medicine

## 2024-08-13 ENCOUNTER — Ambulatory Visit: Payer: Self-pay | Attending: Internal Medicine | Admitting: Internal Medicine

## 2024-08-13 VITALS — BP 132/75 | HR 60 | Temp 98.2°F | Ht 68.0 in | Wt 186.6 lb

## 2024-08-13 DIAGNOSIS — E785 Hyperlipidemia, unspecified: Secondary | ICD-10-CM

## 2024-08-13 DIAGNOSIS — Z7984 Long term (current) use of oral hypoglycemic drugs: Secondary | ICD-10-CM

## 2024-08-13 DIAGNOSIS — G720 Drug-induced myopathy: Secondary | ICD-10-CM

## 2024-08-13 DIAGNOSIS — R030 Elevated blood-pressure reading, without diagnosis of hypertension: Secondary | ICD-10-CM

## 2024-08-13 DIAGNOSIS — E1169 Type 2 diabetes mellitus with other specified complication: Secondary | ICD-10-CM

## 2024-08-13 DIAGNOSIS — Z2821 Immunization not carried out because of patient refusal: Secondary | ICD-10-CM

## 2024-08-13 LAB — GLUCOSE, POCT (MANUAL RESULT ENTRY): POC Glucose: 167 mg/dL — AB (ref 70–99)

## 2024-08-13 LAB — POCT GLYCOSYLATED HEMOGLOBIN (HGB A1C): HbA1c, POC (controlled diabetic range): 9.1 % — AB (ref 0.0–7.0)

## 2024-08-13 MED ORDER — INSULIN GLARGINE 100 UNIT/ML ~~LOC~~ SOLN: SUBCUTANEOUS | 3 refills | Status: AC

## 2024-08-13 MED ORDER — METFORMIN HCL 1000 MG PO TABS: 1000.0000 mg | ORAL_TABLET | Freq: Two times a day (BID) | ORAL | 2 refills | Status: AC
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
## 2024-08-13 NOTE — Patient Instructions (Addendum)
 VISIT SUMMARY: Today, you came in for a follow-up visit to discuss your diabetes management and medication refills. We reviewed your recent blood sugar levels and A1c results, and discussed your experiences with medication and blood pressure readings.  YOUR PLAN: -TYPE 2 DIABETES MELLITUS WITH HYPERGLYCEMIA: Your A1c has increased to 9.1%, which means your blood sugar levels have been higher than recommended. This is likely due to not taking metformin  for several weeks. We have sent refills for metformin  and Lantus  insulin  to your pharmacy. Please resume taking metformin  as prescribed. Continue your daily exercise for 60 minutes and avoid sugary drinks and snacks to help manage your blood sugar levels.  -STATIN MYOPATHY: You experienced muscle aches and stiffness with pravastatin, a medication used to lower cholesterol. You have decided to avoid statin therapy, and we respect your decision.  -ELEVATED BLOOD PRESSURE: Your blood pressure readings have been elevated, with an initial reading of 144/91 mmHg, which improved to 132/75 mmHg. The target is to keep it at 130/80 mmHg or lower. Please check your blood pressure twice a week and record the readings. Limit your salt intake and eat fresh fruits and vegetables daily.  -GENERAL HEALTH MAINTENANCE: You declined the flu and shingles vaccines, but we have documented your TDAP vaccination. Continue to monitor your overall health and maintain a healthy lifestyle.  INSTRUCTIONS: Please follow up with us  if you experience any issues with your medications or if your blood sugar or blood pressure readings remain elevated. Continue to monitor your blood pressure twice a week and record the readings. Resume taking metformin  as prescribed and maintain your daily exercise routine. Limit your salt intake and eat fresh fruits and vegetables daily.                      Contains text generated by [employer].                                  Contains text generated by [employer]bridge.                         Contains text generated by [employer]bridge.

## 2025-01-18 ENCOUNTER — Ambulatory Visit: Payer: Self-pay | Admitting: Internal Medicine

## 2025-01-19 ENCOUNTER — Ambulatory Visit: Payer: Self-pay | Admitting: Internal Medicine
